# Patient Record
Sex: Female | Born: 1970 | Race: Black or African American | Hispanic: No | Marital: Married | Smoking: Never smoker
Health system: Southern US, Community
[De-identification: ages and names within clinical notes are randomized; demographics above are authoritative.]

## PROBLEM LIST (undated history)

## (undated) DIAGNOSIS — I10 Essential (primary) hypertension: Secondary | ICD-10-CM

## (undated) DIAGNOSIS — T7840XA Allergy, unspecified, initial encounter: Secondary | ICD-10-CM

## (undated) DIAGNOSIS — C801 Malignant (primary) neoplasm, unspecified: Secondary | ICD-10-CM

## (undated) HISTORY — PX: TOE AMPUTATION: SHX809

## (undated) HISTORY — DX: Allergy, unspecified, initial encounter: T78.40XA

---

## 2000-02-21 ENCOUNTER — Other Ambulatory Visit: Admission: RE | Admit: 2000-02-21 | Discharge: 2000-02-21 | Payer: Self-pay | Admitting: *Deleted

## 2000-08-23 ENCOUNTER — Emergency Department (HOSPITAL_COMMUNITY): Admission: EM | Admit: 2000-08-23 | Discharge: 2000-08-24 | Payer: Self-pay | Admitting: Emergency Medicine

## 2001-03-19 ENCOUNTER — Other Ambulatory Visit: Admission: RE | Admit: 2001-03-19 | Discharge: 2001-03-19 | Payer: Self-pay | Admitting: *Deleted

## 2001-10-08 ENCOUNTER — Emergency Department (HOSPITAL_COMMUNITY): Admission: EM | Admit: 2001-10-08 | Discharge: 2001-10-08 | Payer: Self-pay | Admitting: Emergency Medicine

## 2002-10-31 ENCOUNTER — Emergency Department (HOSPITAL_COMMUNITY): Admission: EM | Admit: 2002-10-31 | Discharge: 2002-10-31 | Payer: Self-pay | Admitting: Emergency Medicine

## 2005-07-01 ENCOUNTER — Encounter: Admission: RE | Admit: 2005-07-01 | Discharge: 2005-09-29 | Payer: Self-pay | Admitting: Internal Medicine

## 2010-05-01 ENCOUNTER — Emergency Department (HOSPITAL_COMMUNITY): Admission: EM | Admit: 2010-05-01 | Discharge: 2010-05-01 | Payer: Self-pay | Admitting: Emergency Medicine

## 2010-05-04 ENCOUNTER — Inpatient Hospital Stay (HOSPITAL_COMMUNITY): Admission: AD | Admit: 2010-05-04 | Discharge: 2010-05-08 | Payer: Self-pay | Admitting: Obstetrics and Gynecology

## 2010-05-08 ENCOUNTER — Encounter: Admission: RE | Admit: 2010-05-08 | Discharge: 2010-05-08 | Payer: Self-pay | Admitting: Obstetrics and Gynecology

## 2010-09-14 ENCOUNTER — Inpatient Hospital Stay (HOSPITAL_COMMUNITY): Admission: AD | Admit: 2010-09-14 | Discharge: 2010-09-14 | Payer: Self-pay | Admitting: Obstetrics and Gynecology

## 2010-09-23 ENCOUNTER — Inpatient Hospital Stay (HOSPITAL_COMMUNITY): Admission: AD | Admit: 2010-09-23 | Discharge: 2010-09-27 | Payer: Self-pay | Admitting: Obstetrics and Gynecology

## 2010-09-23 ENCOUNTER — Encounter (INDEPENDENT_AMBULATORY_CARE_PROVIDER_SITE_OTHER): Payer: Self-pay | Admitting: Obstetrics and Gynecology

## 2010-10-22 ENCOUNTER — Ambulatory Visit: Payer: Self-pay | Admitting: Oncology

## 2010-10-26 ENCOUNTER — Ambulatory Visit: Payer: Self-pay | Admitting: Genetic Counselor

## 2010-10-30 ENCOUNTER — Ambulatory Visit (HOSPITAL_COMMUNITY)
Admission: RE | Admit: 2010-10-30 | Discharge: 2010-10-30 | Payer: Self-pay | Source: Home / Self Care | Attending: General Surgery | Admitting: General Surgery

## 2010-11-06 ENCOUNTER — Ambulatory Visit (HOSPITAL_COMMUNITY): Admission: RE | Admit: 2010-11-06 | Payer: Self-pay | Source: Home / Self Care | Admitting: Oncology

## 2010-12-02 ENCOUNTER — Encounter: Payer: Self-pay | Admitting: Oncology

## 2011-01-11 ENCOUNTER — Other Ambulatory Visit (HOSPITAL_COMMUNITY): Payer: Self-pay | Admitting: General Surgery

## 2011-01-11 DIAGNOSIS — C4370 Malignant melanoma of unspecified lower limb, including hip: Secondary | ICD-10-CM

## 2011-01-21 LAB — URINALYSIS, ROUTINE W REFLEX MICROSCOPIC
Leukocytes, UA: NEGATIVE
Nitrite: NEGATIVE
Protein, ur: NEGATIVE mg/dL
Urobilinogen, UA: 0.2 mg/dL (ref 0.0–1.0)

## 2011-01-21 LAB — DIFFERENTIAL
Lymphocytes Relative: 30 % (ref 12–46)
Monocytes Absolute: 0.3 10*3/uL (ref 0.1–1.0)
Monocytes Relative: 4 % (ref 3–12)
Neutro Abs: 4.8 10*3/uL (ref 1.7–7.7)

## 2011-01-21 LAB — COMPREHENSIVE METABOLIC PANEL
AST: 22 U/L (ref 0–37)
Albumin: 3.5 g/dL (ref 3.5–5.2)
BUN: 8 mg/dL (ref 6–23)
Creatinine, Ser: 0.72 mg/dL (ref 0.4–1.2)
GFR calc Af Amer: >60 mL/min (ref >60)
Potassium: 3.8 mEq/L (ref 3.5–5.1)
Total Protein: 6.4 g/dL (ref 6.0–8.3)

## 2011-01-21 LAB — URINE MICROSCOPIC-ADD ON

## 2011-01-21 LAB — CBC
MCV: 83.4 fL (ref 78.0–100.0)
Platelets: 248 10*3/uL (ref 150–400)
RDW: 13.5 % (ref 11.5–15.5)
WBC: 7.8 10*3/uL (ref 4.0–10.5)

## 2011-01-21 LAB — APTT: aPTT: 27 seconds (ref 24–37)

## 2011-01-21 LAB — SURGICAL PCR SCREEN
MRSA, PCR: NEGATIVE
Staphylococcus aureus: NEGATIVE

## 2011-01-22 LAB — CBC
HCT: 36.3 % (ref 36.0–46.0)
HCT: 36.3 % (ref 36.0–46.0)
Hemoglobin: 12 g/dL (ref 12.0–15.0)
MCH: 29.7 pg (ref 26.0–34.0)
MCH: 29.8 pg (ref 26.0–34.0)
MCHC: 33.7 g/dL (ref 30.0–36.0)
MCHC: 34 g/dL (ref 30.0–36.0)
MCHC: 34.1 g/dL (ref 30.0–36.0)
MCV: 87.2 fL (ref 78.0–100.0)
MCV: 87.6 fL (ref 78.0–100.0)
MCV: 87.8 fL (ref 78.0–100.0)
MCV: 87.8 fL (ref 78.0–100.0)
Platelets: 193 10*3/uL (ref 150–400)
Platelets: 205 10*3/uL (ref 150–400)
Platelets: 225 10*3/uL (ref 150–400)
Platelets: 230 10*3/uL (ref 150–400)
RBC: 3.62 MIL/uL — ABNORMAL LOW (ref 3.87–5.11)
RBC: 4.13 MIL/uL (ref 3.87–5.11)
RBC: 4.13 MIL/uL (ref 3.87–5.11)
RDW: 13.9 % (ref 11.5–15.5)
RDW: 14.3 % (ref 11.5–15.5)
RDW: 14.4 % (ref 11.5–15.5)
WBC: 8.5 10*3/uL (ref 4.0–10.5)
WBC: 8.8 10*3/uL (ref 4.0–10.5)
WBC: 9.5 10*3/uL (ref 4.0–10.5)

## 2011-01-22 LAB — COMPREHENSIVE METABOLIC PANEL
Albumin: 2.7 g/dL — ABNORMAL LOW (ref 3.5–5.2)
Alkaline Phosphatase: 140 U/L — ABNORMAL HIGH (ref 39–117)
Alkaline Phosphatase: 154 U/L — ABNORMAL HIGH (ref 39–117)
BUN: 11 mg/dL (ref 6–23)
BUN: 9 mg/dL (ref 6–23)
Chloride: 108 mEq/L (ref 96–112)
Chloride: 110 mEq/L (ref 96–112)
Glucose, Bld: 81 mg/dL (ref 70–99)
Potassium: 3.9 mEq/L (ref 3.5–5.1)
Potassium: 4.2 mEq/L (ref 3.5–5.1)
Total Bilirubin: 0.4 mg/dL (ref 0.3–1.2)
Total Bilirubin: 0.4 mg/dL (ref 0.3–1.2)

## 2011-01-22 LAB — URIC ACID
Uric Acid, Serum: 5.6 mg/dL (ref 2.4–7.0)
Uric Acid, Serum: 6.4 mg/dL (ref 2.4–7.0)

## 2011-01-22 LAB — GLUCOSE, CAPILLARY
Glucose-Capillary: 100 mg/dL — ABNORMAL HIGH (ref 70–99)
Glucose-Capillary: 100 mg/dL — ABNORMAL HIGH (ref 70–99)
Glucose-Capillary: 140 mg/dL — ABNORMAL HIGH (ref 70–99)
Glucose-Capillary: 63 mg/dL — ABNORMAL LOW (ref 70–99)
Glucose-Capillary: 64 mg/dL — ABNORMAL LOW (ref 70–99)
Glucose-Capillary: 68 mg/dL — ABNORMAL LOW (ref 70–99)
Glucose-Capillary: 72 mg/dL (ref 70–99)
Glucose-Capillary: 80 mg/dL (ref 70–99)
Glucose-Capillary: 93 mg/dL (ref 70–99)

## 2011-01-22 LAB — RPR: RPR Ser Ql: NONREACTIVE

## 2011-01-22 LAB — URINALYSIS, ROUTINE W REFLEX MICROSCOPIC
Bilirubin Urine: NEGATIVE
Glucose, UA: NEGATIVE mg/dL
Ketones, ur: NEGATIVE mg/dL
Nitrite: NEGATIVE
pH: 7 (ref 5.0–8.0)

## 2011-01-24 ENCOUNTER — Encounter (HOSPITAL_COMMUNITY)
Admission: RE | Admit: 2011-01-24 | Discharge: 2011-01-24 | Disposition: A | Discharge status: Home / Self Care | Payer: BC Managed Care – PPO | Source: Ambulatory Visit | Attending: General Surgery | Admitting: General Surgery

## 2011-01-24 ENCOUNTER — Encounter (HOSPITAL_COMMUNITY): Payer: Self-pay

## 2011-01-24 DIAGNOSIS — C4370 Malignant melanoma of unspecified lower limb, including hip: Secondary | ICD-10-CM | POA: Insufficient documentation

## 2011-01-24 HISTORY — DX: Essential (primary) hypertension: I10

## 2011-01-24 HISTORY — DX: Malignant (primary) neoplasm, unspecified: C80.1

## 2011-01-24 LAB — GLUCOSE, CAPILLARY: Glucose-Capillary: 120 mg/dL — ABNORMAL HIGH (ref 70–99)

## 2011-01-24 MED ORDER — FLUDEOXYGLUCOSE F - 18 (FDG) INJECTION
18.3000 | Freq: Once | INTRAVENOUS | Status: AC | PRN
  Administered 2011-01-24: 18.3 via INTRAVENOUS

## 2011-01-27 LAB — GLUCOSE, CAPILLARY
Glucose-Capillary: 138 mg/dL — ABNORMAL HIGH (ref 70–99)
Glucose-Capillary: 144 mg/dL — ABNORMAL HIGH (ref 70–99)
Glucose-Capillary: 148 mg/dL — ABNORMAL HIGH (ref 70–99)
Glucose-Capillary: 150 mg/dL — ABNORMAL HIGH (ref 70–99)
Glucose-Capillary: 151 mg/dL — ABNORMAL HIGH (ref 70–99)
Glucose-Capillary: 171 mg/dL — ABNORMAL HIGH (ref 70–99)
Glucose-Capillary: 184 mg/dL — ABNORMAL HIGH (ref 70–99)
Glucose-Capillary: 189 mg/dL — ABNORMAL HIGH (ref 70–99)
Glucose-Capillary: 195 mg/dL — ABNORMAL HIGH (ref 70–99)
Glucose-Capillary: 198 mg/dL — ABNORMAL HIGH (ref 70–99)
Glucose-Capillary: 203 mg/dL — ABNORMAL HIGH (ref 70–99)
Glucose-Capillary: 232 mg/dL — ABNORMAL HIGH (ref 70–99)

## 2011-01-27 LAB — CBC
HCT: 35.5 % — ABNORMAL LOW (ref 36.0–46.0)
Hemoglobin: 11.9 g/dL — ABNORMAL LOW (ref 12.0–15.0)
MCH: 29.4 pg (ref 26.0–34.0)
RBC: 4.07 MIL/uL (ref 3.87–5.11)

## 2011-01-27 LAB — COMPREHENSIVE METABOLIC PANEL
ALT: 15 U/L (ref 0–35)
AST: 15 U/L (ref 0–37)
CO2: 22 mEq/L (ref 19–32)
Chloride: 106 mEq/L (ref 96–112)
Creatinine, Ser: 0.45 mg/dL (ref 0.4–1.2)
GFR calc Af Amer: >60 mL/min (ref >60)
GFR calc non Af Amer: >60 mL/min (ref >60)
Sodium: 133 mEq/L — ABNORMAL LOW (ref 135–145)
Total Bilirubin: 0.4 mg/dL (ref 0.3–1.2)

## 2011-01-27 LAB — URINALYSIS, ROUTINE W REFLEX MICROSCOPIC
Bilirubin Urine: NEGATIVE
Bilirubin Urine: NEGATIVE
Glucose, UA: 100 mg/dL — AB
Glucose, UA: NEGATIVE mg/dL
Hgb urine dipstick: NEGATIVE
Ketones, ur: 15 mg/dL — AB
Specific Gravity, Urine: 1.025 (ref 1.005–1.030)
Specific Gravity, Urine: >1.03 — ABNORMAL HIGH (ref 1.005–1.030)
Urobilinogen, UA: 0.2 mg/dL (ref 0.0–1.0)
pH: 6 (ref 5.0–8.0)

## 2011-03-20 ENCOUNTER — Encounter (INDEPENDENT_AMBULATORY_CARE_PROVIDER_SITE_OTHER): Payer: Self-pay | Admitting: General Surgery

## 2011-09-30 IMAGING — CR DG CHEST 2V
2 series · 2 of 2 positions shown · non-contrast
Comparison: None.

CLINICAL DATA: Fifth toe melanoma.  Preoperative evaluation.

CHEST - 2 VIEW

[view not recorded (1 of 2)]
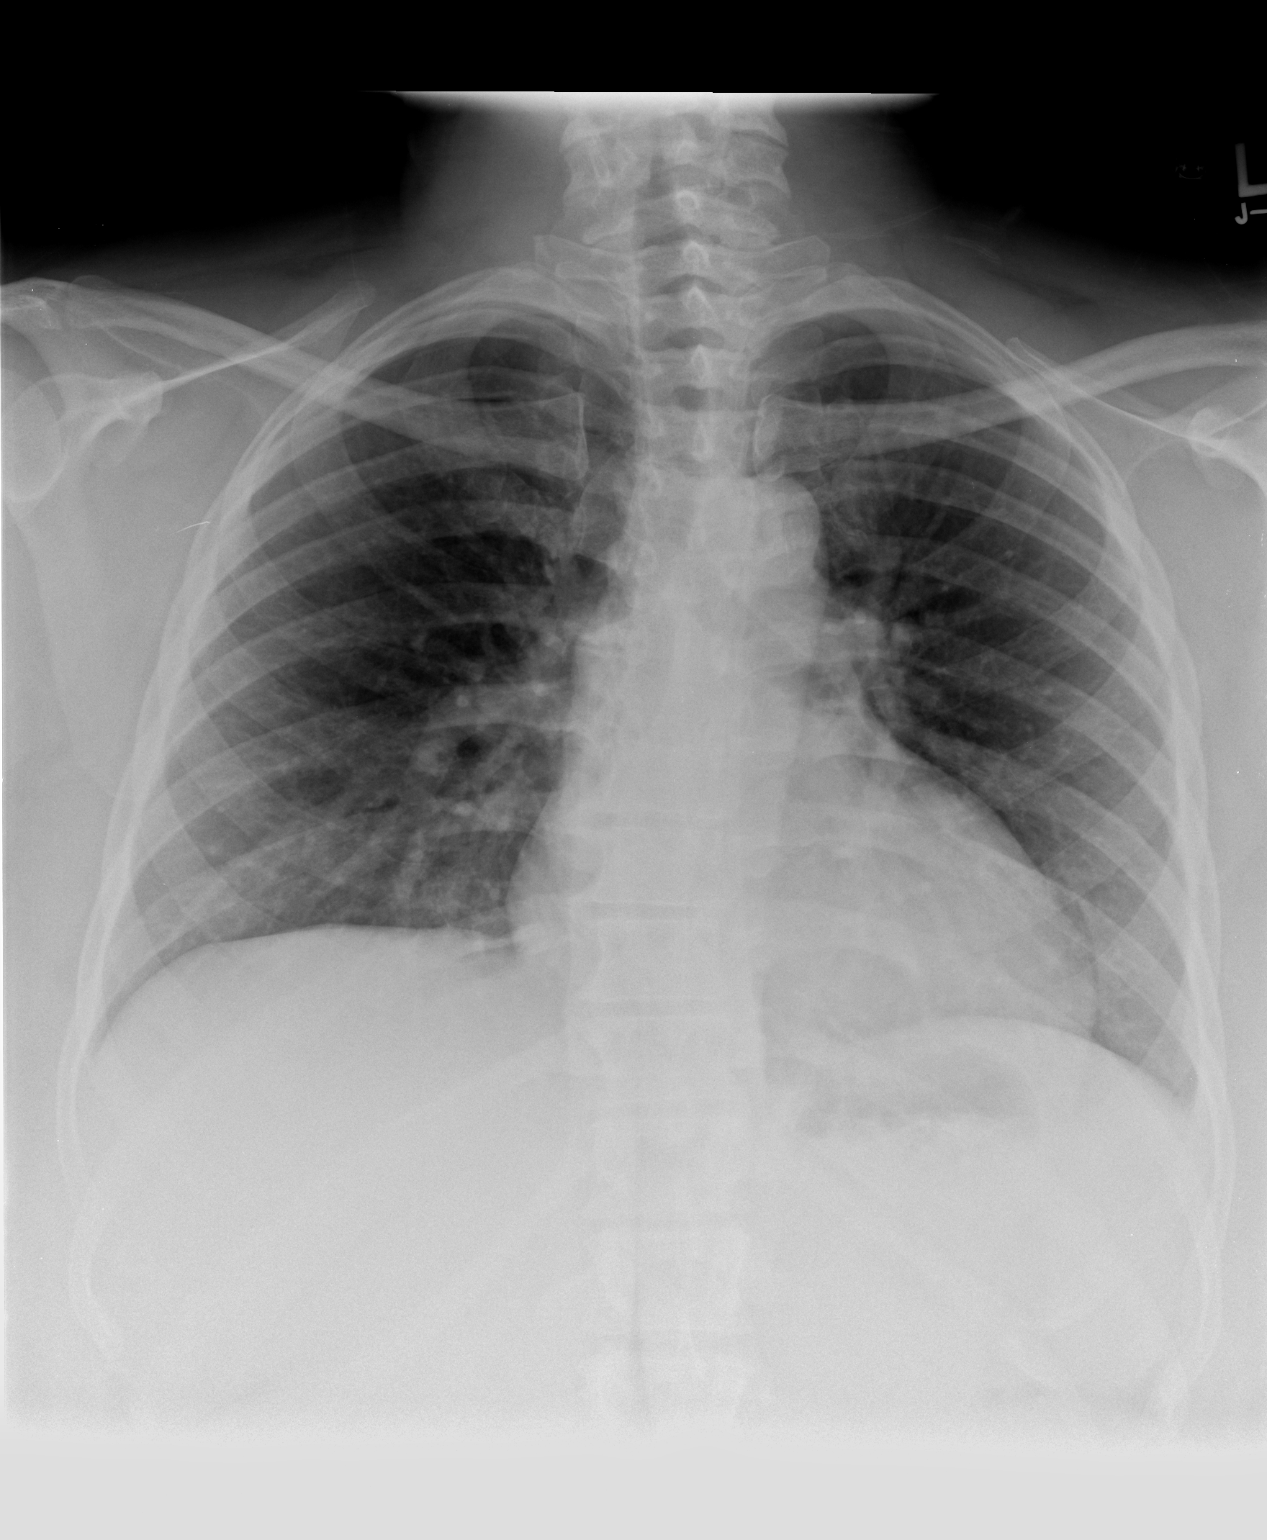

[view not recorded (2 of 2)]
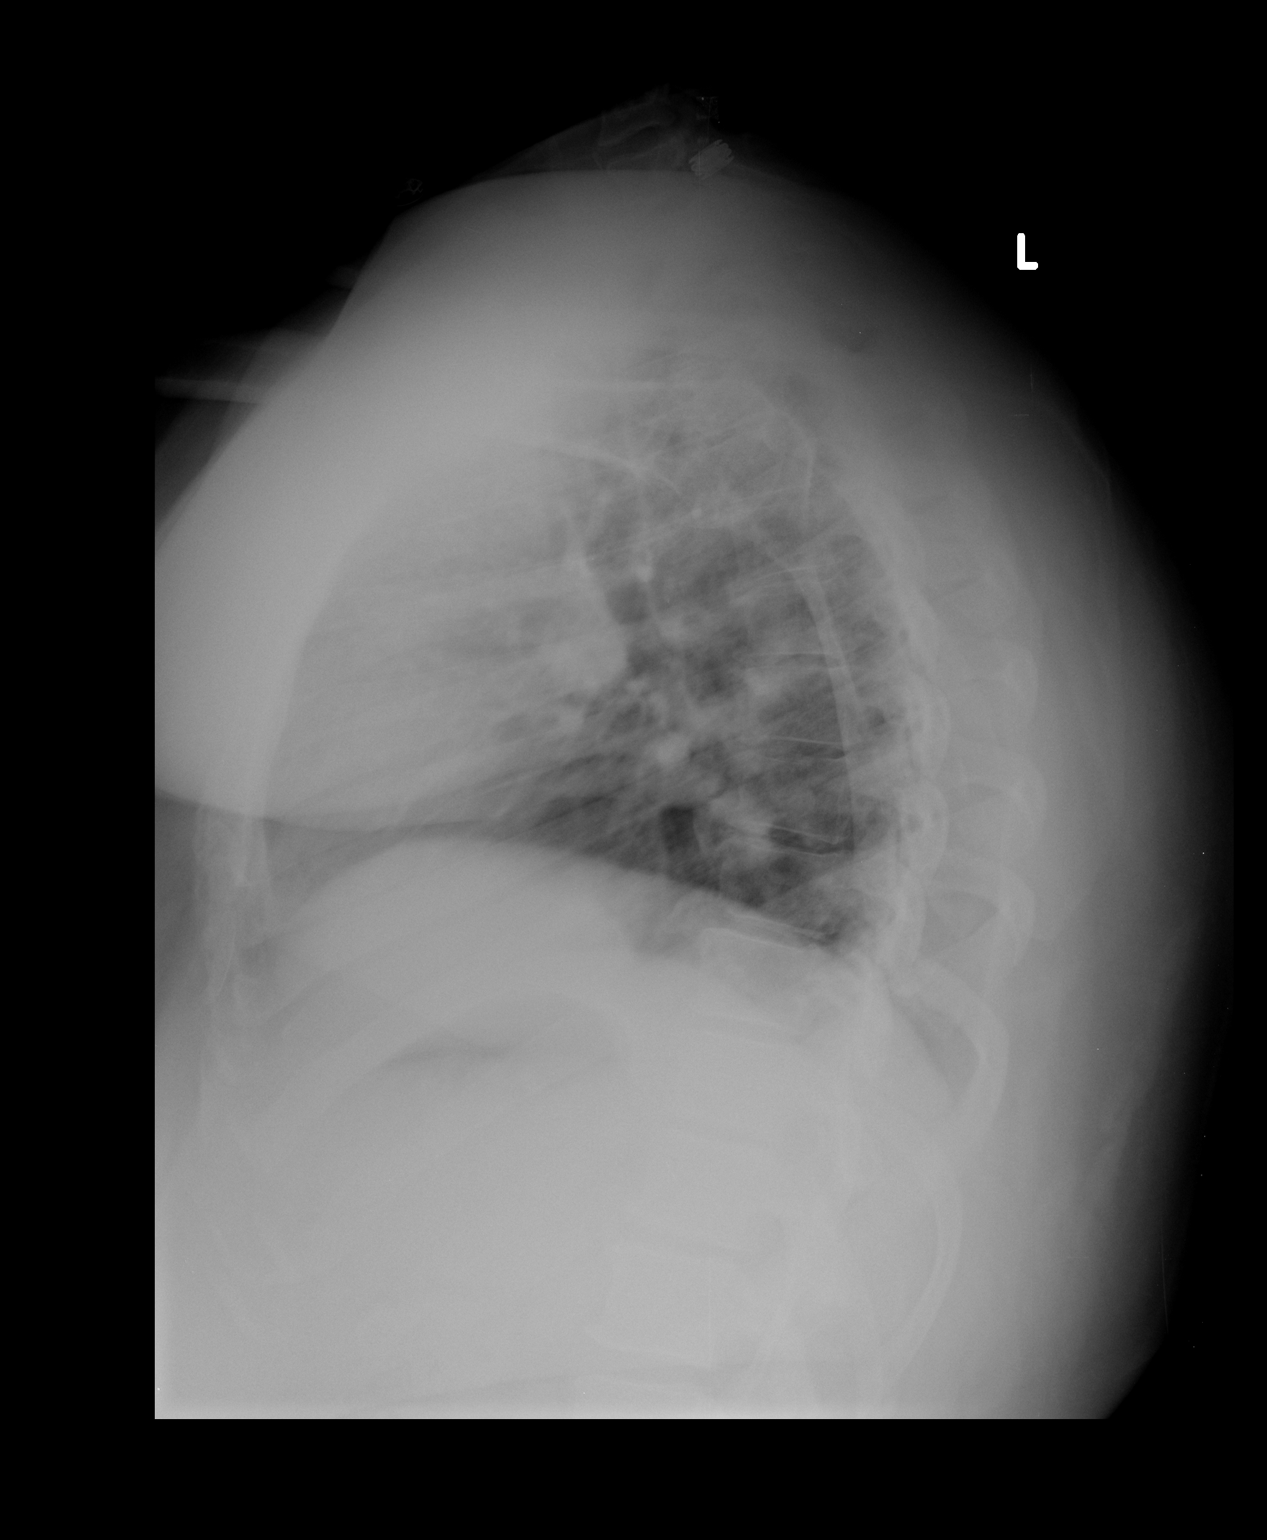

[2 of 2 positions shown; findings below may reference images not displayed]

FINDINGS: The lungs are low volume but clear bilaterally.  No
confluent airspace opacities, pleural effuions or pneumothoracies
are seen.  The heart is normal in size and contour.  The upper
abdomen and osseous structures are normal.
IMPRESSION: No acute cardiopulmonary disease.

## 2012-03-20 ENCOUNTER — Other Ambulatory Visit: Payer: Self-pay | Admitting: Obstetrics and Gynecology

## 2013-04-08 ENCOUNTER — Other Ambulatory Visit: Payer: Self-pay | Admitting: Obstetrics and Gynecology

## 2013-06-10 ENCOUNTER — Encounter: Payer: BC Managed Care – PPO | Attending: Obstetrics and Gynecology

## 2013-06-10 VITALS — Ht 67.0 in | Wt 257.6 lb

## 2013-06-10 DIAGNOSIS — E119 Type 2 diabetes mellitus without complications: Secondary | ICD-10-CM | POA: Insufficient documentation

## 2013-06-10 DIAGNOSIS — Z713 Dietary counseling and surveillance: Secondary | ICD-10-CM | POA: Insufficient documentation

## 2013-06-10 NOTE — Patient Instructions (Signed)
Goals:  Follow Diabetes Meal Plan as instructed  Eat 3 meals and 2 snacks, every 3-5 hrs  Limit carbohydrate intake to 30-45 grams carbohydrate/meal  Limit carbohydrate intake to 15 grams carbohydrate/snack  Add lean protein foods to meals/snacks  Monitor glucose levels as instructed by your doctor  Aim for 30 mins of physical activity daily  Bring food record and glucose log to your next nutrition visit 

## 2013-06-10 NOTE — Progress Notes (Signed)
Patient was seen on 06/10/13 for the first of a series of three diabetes self-management courses at the [facility].   Current HbA1c: 9.8%  The following learning objectives were met by the patient during this course:   Defines the role of glucose and insulin  Identifies type of diabetes and pathophysiology  Defines the diagnostic criteria for diabetes and prediabetes  States the risk factors for Type 2 Diabetes  States the symptoms of Type 2 Diabetes  Defines Type 2 Diabetes treatment goals  Defines Type 2 Diabetes treatment options  States the rationale for glucose monitoring  Identifies A1C, glucose targets, and testing times  Identifies proper sharps disposal  Defines the purpose of a diabetes food plan  Identifies carbohydrate food groups  Defines effects of carbohydrate foods on glucose levels  Identifies carbohydrate choices/grams/food labels  States benefits of physical activity and effect on glucose  Review of suggested activity guidelines  Handouts given during class include:  Type 2 Diabetes: Basics Book  My Food Plan Book  Food and Activity Log  Your patient has identified their diabetes self-care support plan as:  [facility] support group  Follow-Up Plan: Attend core 2 and core 3

## 2013-07-01 ENCOUNTER — Encounter: Payer: BC Managed Care – PPO | Attending: Obstetrics and Gynecology

## 2013-07-01 DIAGNOSIS — Z713 Dietary counseling and surveillance: Secondary | ICD-10-CM | POA: Insufficient documentation

## 2013-07-01 DIAGNOSIS — E119 Type 2 diabetes mellitus without complications: Secondary | ICD-10-CM | POA: Insufficient documentation

## 2013-07-01 NOTE — Progress Notes (Signed)
Patient was seen on 07/01/13 for the second of a series of three diabetes self-management courses at the [facility]. The following learning objectives were met by the patient during this course:   Explain basic nutrition maintenance and quality assurance  Describe causes, symptoms and treatment of hypoglycemia and hyperglycemia  Explain how to manage diabetes during illness  Describe the importance of good nutrition for health and healthy eating strategies  List strategies to follow meal plan when dining out  Describe the effects of alcohol on glucose and how to use it safely  Describe problem solving skills for day-to-day glucose challenges  Describe strategies to use when treatment plan needs to change  Identify important factors involved in successful weight loss  Describe ways to remain physically active  Describe the impact of regular activity on insulin resistance  Identify current diabetes medications, their action on blood glucose, and [pssible side effects.  Handouts given in class:  Refrigerator magnet for Sick Day Guidelines  [facility] Oral medication/insulin handout  Your patient has identified their diabetes self-care support plan as:  [facility] support group   Follow-Up Plan: Patient will attend the final class of the ADA Diabetes Self-Care Education.   

## 2013-09-08 ENCOUNTER — Encounter: Payer: BC Managed Care – PPO | Attending: Family Medicine

## 2013-09-08 DIAGNOSIS — Z713 Dietary counseling and surveillance: Secondary | ICD-10-CM | POA: Insufficient documentation

## 2013-09-08 DIAGNOSIS — E119 Type 2 diabetes mellitus without complications: Secondary | ICD-10-CM | POA: Insufficient documentation

## 2014-01-13 ENCOUNTER — Ambulatory Visit: Payer: BC Managed Care – PPO | Admitting: *Deleted

## 2014-01-16 ENCOUNTER — Encounter (HOSPITAL_COMMUNITY): Payer: Self-pay | Admitting: Emergency Medicine

## 2014-01-16 ENCOUNTER — Emergency Department (HOSPITAL_COMMUNITY)
Admission: EM | Admit: 2014-01-16 | Discharge: 2014-01-16 | Disposition: A | Discharge status: Home / Self Care | Payer: BC Managed Care – PPO | Attending: Emergency Medicine | Admitting: Emergency Medicine

## 2014-01-16 DIAGNOSIS — Z87828 Personal history of other (healed) physical injury and trauma: Secondary | ICD-10-CM | POA: Insufficient documentation

## 2014-01-16 DIAGNOSIS — I10 Essential (primary) hypertension: Secondary | ICD-10-CM | POA: Insufficient documentation

## 2014-01-16 DIAGNOSIS — M5430 Sciatica, unspecified side: Secondary | ICD-10-CM | POA: Insufficient documentation

## 2014-01-16 DIAGNOSIS — Z79899 Other long term (current) drug therapy: Secondary | ICD-10-CM | POA: Insufficient documentation

## 2014-01-16 DIAGNOSIS — Z880 Allergy status to penicillin: Secondary | ICD-10-CM | POA: Insufficient documentation

## 2014-01-16 DIAGNOSIS — Z85828 Personal history of other malignant neoplasm of skin: Secondary | ICD-10-CM | POA: Insufficient documentation

## 2014-01-16 DIAGNOSIS — E119 Type 2 diabetes mellitus without complications: Secondary | ICD-10-CM | POA: Insufficient documentation

## 2014-01-16 MED ORDER — OXYCODONE-ACETAMINOPHEN 5-325 MG PO TABS
1.0000 | ORAL_TABLET | Freq: Once | ORAL | Status: AC
  Administered 2014-01-16: 1 via ORAL
  Filled 2014-01-16: qty 1

## 2014-01-16 MED ORDER — DIAZEPAM 5 MG PO TABS
5.0000 mg | ORAL_TABLET | Freq: Once | ORAL | Status: AC
  Administered 2014-01-16: 5 mg via ORAL
  Filled 2014-01-16: qty 1

## 2014-01-16 MED ORDER — CYCLOBENZAPRINE HCL 10 MG PO TABS: 10.0000 mg | ORAL_TABLET | Freq: Two times a day (BID) | ORAL | Status: DC | PRN

## 2014-01-16 MED ORDER — IBUPROFEN 600 MG PO TABS
600.0000 mg | ORAL_TABLET | Freq: Four times a day (QID) | ORAL | Status: DC | PRN
Start: 2014-01-16 — End: 2022-02-01

## 2014-01-16 MED ORDER — HYDROCODONE-ACETAMINOPHEN 5-325 MG PO TABS: 1.0000 | ORAL_TABLET | ORAL | Status: DC | PRN

## 2014-01-16 NOTE — ED Notes (Signed)
Pt given ICE pack for transport.
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
## 2014-01-16 NOTE — Discharge Instructions (Signed)
Sciatica °Sciatica is pain, weakness, numbness, or tingling along the path of the sciatic nerve. The nerve starts in the lower back and runs down the back of each leg. The nerve controls the muscles in the lower leg and in the back of the knee, while also providing sensation to the back of the thigh, lower leg, and the sole of your foot. Sciatica is a symptom of another medical condition. For instance, nerve damage or certain conditions, such as a herniated disk or bone spur on the spine, pinch or put pressure on the sciatic nerve. This causes the pain, weakness, or other sensations normally associated with sciatica. Generally, sciatica only affects one side of the body. °CAUSES  °· Herniated or slipped disc. °· Degenerative disk disease. °· A pain disorder involving the narrow muscle in the buttocks (piriformis syndrome). °· Pelvic injury or fracture. °· Pregnancy. °· Tumor (rare). °SYMPTOMS  °Symptoms can vary from mild to very severe. The symptoms usually travel from the low back to the buttocks and down the back of the leg. Symptoms can include: °· Mild tingling or dull aches in the lower back, leg, or hip. °· Numbness in the back of the calf or sole of the foot. °· Burning sensations in the lower back, leg, or hip. °· Sharp pains in the lower back, leg, or hip. °· Leg weakness. °· Severe back pain inhibiting movement. °These symptoms may get worse with coughing, sneezing, laughing, or prolonged sitting or standing. Also, being overweight may worsen symptoms. °DIAGNOSIS  °Your caregiver will perform a physical exam to look for common symptoms of sciatica. He or she may ask you to do certain movements or activities that would trigger sciatic nerve pain. Other tests may be performed to find the cause of the sciatica. These may include: °· Blood tests. °· X-rays. °· Imaging tests, such as an MRI or CT scan. °TREATMENT  °Treatment is directed at the cause of the sciatic pain. Sometimes, treatment is not necessary  and the pain and discomfort goes away on its own. If treatment is needed, your caregiver may suggest: °· Over-the-counter medicines to relieve pain. °· Prescription medicines, such as anti-inflammatory medicine, muscle relaxants, or narcotics. °· Applying heat or ice to the painful area. °· Steroid injections to lessen pain, irritation, and inflammation around the nerve. °· Reducing activity during periods of pain. °· Exercising and stretching to strengthen your abdomen and improve flexibility of your spine. Your caregiver may suggest losing weight if the extra weight makes the back pain worse. °· Physical therapy. °· Surgery to eliminate what is pressing or pinching the nerve, such as a bone spur or part of a herniated disk. °HOME CARE INSTRUCTIONS  °· Only take over-the-counter or prescription medicines for pain or discomfort as directed by your caregiver. °· Apply ice to the affected area for 20 minutes, 3 4 times a day for the first 48 72 hours. Then try heat in the same way. °· Exercise, stretch, or perform your usual activities if these do not aggravate your pain. °· Attend physical therapy sessions as directed by your caregiver. °· Keep all follow-up appointments as directed by your caregiver. °· Do not wear high heels or shoes that do not provide proper support. °· Check your mattress to see if it is too soft. A firm mattress may lessen your pain and discomfort. °SEEK IMMEDIATE MEDICAL CARE IF:  °· You lose control of your bowel or bladder (incontinence). °· You have increasing weakness in the lower back,   pelvis, buttocks, or legs. °· You have redness or swelling of your back. °· You have a burning sensation when you urinate. °· You have pain that gets worse when you lie down or awakens you at night. °· Your pain is worse than you have experienced in the past. °· Your pain is lasting longer than 4 weeks. °· You are suddenly losing weight without reason. °MAKE SURE YOU: °· Understand these  instructions. °· Will watch your condition. °· Will get help right away if you are not doing well or get worse. °Document Released: 10/22/2001 Document Revised: 04/28/2012 Document Reviewed: 03/08/2012 °Exit[employer]® Patient Information ©2014 [employer]. ° °
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
## 2014-02-10 ENCOUNTER — Other Ambulatory Visit: Payer: Self-pay | Admitting: Orthopedic Surgery

## 2014-02-10 DIAGNOSIS — M545 Low back pain, unspecified: Secondary | ICD-10-CM

## 2014-02-17 ENCOUNTER — Ambulatory Visit
Admission: RE | Admit: 2014-02-17 | Discharge: 2014-02-17 | Disposition: A | Discharge status: Home / Self Care | Payer: BC Managed Care – PPO | Source: Ambulatory Visit | Attending: Orthopedic Surgery | Admitting: Orthopedic Surgery

## 2014-02-17 DIAGNOSIS — M545 Low back pain, unspecified: Secondary | ICD-10-CM

## 2019-07-01 ENCOUNTER — Other Ambulatory Visit: Payer: Self-pay

## 2019-07-01 ENCOUNTER — Encounter (HOSPITAL_COMMUNITY): Payer: Self-pay | Admitting: Emergency Medicine

## 2019-07-01 ENCOUNTER — Emergency Department (HOSPITAL_COMMUNITY)
Admission: EM | Admit: 2019-07-01 | Discharge: 2019-07-01 | Disposition: A | Discharge status: Home / Self Care | Payer: Medicaid Other | Attending: Emergency Medicine | Admitting: Emergency Medicine

## 2019-07-01 DIAGNOSIS — E1165 Type 2 diabetes mellitus with hyperglycemia: Secondary | ICD-10-CM | POA: Insufficient documentation

## 2019-07-01 DIAGNOSIS — Z760 Encounter for issue of repeat prescription: Secondary | ICD-10-CM

## 2019-07-01 DIAGNOSIS — R1084 Generalized abdominal pain: Secondary | ICD-10-CM | POA: Diagnosis not present

## 2019-07-01 DIAGNOSIS — R1031 Right lower quadrant pain: Secondary | ICD-10-CM | POA: Diagnosis present

## 2019-07-01 DIAGNOSIS — Z7984 Long term (current) use of oral hypoglycemic drugs: Secondary | ICD-10-CM | POA: Insufficient documentation

## 2019-07-01 DIAGNOSIS — Z79899 Other long term (current) drug therapy: Secondary | ICD-10-CM | POA: Diagnosis not present

## 2019-07-01 DIAGNOSIS — I10 Essential (primary) hypertension: Secondary | ICD-10-CM | POA: Diagnosis not present

## 2019-07-01 LAB — COMPREHENSIVE METABOLIC PANEL
ALT: 17 U/L (ref 0–44)
AST: 16 U/L (ref 15–41)
Albumin: 3.9 g/dL (ref 3.5–5.0)
Alkaline Phosphatase: 89 U/L (ref 38–126)
Anion gap: 10 (ref 5–15)
BUN: 22 mg/dL — ABNORMAL HIGH (ref 6–20)
CO2: 24 mmol/L (ref 22–32)
Calcium: 9.4 mg/dL (ref 8.9–10.3)
Chloride: 100 mmol/L (ref 98–111)
Creatinine, Ser: 0.82 mg/dL (ref 0.44–1.00)
GFR calc Af Amer: >60 mL/min (ref >60)
GFR calc non Af Amer: >60 mL/min (ref >60)
Glucose, Bld: 389 mg/dL — ABNORMAL HIGH (ref 70–99)
Potassium: 3.6 mmol/L (ref 3.5–5.1)
Sodium: 134 mmol/L — ABNORMAL LOW (ref 135–145)
Total Bilirubin: 0.6 mg/dL (ref 0.3–1.2)
Total Protein: 7.8 g/dL (ref 6.5–8.1)

## 2019-07-01 LAB — URINALYSIS, ROUTINE W REFLEX MICROSCOPIC
Bacteria, UA: NONE SEEN
Bilirubin Urine: NEGATIVE
Glucose, UA: >=500 mg/dL — AB
Hgb urine dipstick: NEGATIVE
Ketones, ur: NEGATIVE mg/dL
Leukocytes,Ua: NEGATIVE
Nitrite: NEGATIVE
Protein, ur: NEGATIVE mg/dL
Specific Gravity, Urine: 1.032 — ABNORMAL HIGH (ref 1.005–1.030)
pH: 7 (ref 5.0–8.0)

## 2019-07-01 LAB — I-STAT BETA HCG BLOOD, ED (MC, WL, AP ONLY): I-stat hCG, quantitative: <5 m[IU]/mL (ref <5)

## 2019-07-01 LAB — CBC
HCT: 41.6 % (ref 36.0–46.0)
Hemoglobin: 13.6 g/dL (ref 12.0–15.0)
MCH: 27.9 pg (ref 26.0–34.0)
MCHC: 32.7 g/dL (ref 30.0–36.0)
MCV: 85.4 fL (ref 80.0–100.0)
Platelets: 271 10*3/uL (ref 150–400)
RBC: 4.87 MIL/uL (ref 3.87–5.11)
RDW: 13.4 % (ref 11.5–15.5)
WBC: 7.4 10*3/uL (ref 4.0–10.5)
nRBC: 0 % (ref 0.0–0.2)

## 2019-07-01 LAB — LIPASE, BLOOD: Lipase: 30 U/L (ref 11–51)

## 2019-07-01 MED ORDER — METFORMIN HCL 500 MG PO TABS: 500.0000 mg | ORAL_TABLET | Freq: Two times a day (BID) | ORAL | 0 refills | Status: DC

## 2019-07-01 MED ORDER — BLOOD GLUCOSE MONITOR KIT: PACK | 0 refills | Status: AC

## 2019-07-01 MED ORDER — LISINOPRIL 10 MG PO TABS: 10.0000 mg | ORAL_TABLET | Freq: Every day | ORAL | 0 refills | Status: DC

## 2019-07-01 MED ORDER — SODIUM CHLORIDE 0.9 % IV BOLUS
1000.0000 mL | Freq: Once | INTRAVENOUS | Status: AC
  Administered 2019-07-01: 21:00:00 1000 mL via INTRAVENOUS

## 2019-07-01 MED ORDER — SODIUM CHLORIDE 0.9% FLUSH
3.0000 mL | Freq: Once | INTRAVENOUS | Status: AC
  Administered 2019-07-01: 3 mL via INTRAVENOUS

## 2019-07-01 MED ORDER — FENTANYL CITRATE (PF) 100 MCG/2ML IJ SOLN
50.0000 ug | Freq: Once | INTRAMUSCULAR | Status: AC
  Administered 2019-07-01: 50 ug via INTRAVENOUS
  Filled 2019-07-01: qty 2

## 2019-07-01 MED ORDER — ONDANSETRON HCL 4 MG/2ML IJ SOLN
4.0000 mg | Freq: Once | INTRAMUSCULAR | Status: AC
  Administered 2019-07-01: 4 mg via INTRAVENOUS
  Filled 2019-07-01: qty 2

## 2019-07-01 NOTE — Progress Notes (Signed)
CSW received a call from the [facility] stating pt may need medications but also needs a PCP appointment.    EDP is aware that if pt has Medicaid then the pt has been assigned a PCP that the pt cannot deviate from unless a process is followed.  However, per the EDP, pt states she does not have Medicaid anymore and is requesting a PCP follow up.  EDP states EDP will place a [facility] RN CM consult with an order for face-to-face and an order for meds.  CSW talked to RN who verified pt's phone number on [facility] is correct: [phone] (Mobile)  Per EDP, English is pt's preferred language.  2nd shift ED CSW will leave handoff for 1st shift TOC RN CM.  CSW will continue to follow for D/C needs.  Alphonse Guild. Deval Mroczka, LCSW, LCAS, CSI Transitions of Care Clinical Social Worker Care Coordination Department Ph: [phone]

## 2019-07-01 NOTE — ED Provider Notes (Signed)
[facility] DEPT Provider Note   CSN: [id number] Arrival date & time: 07/01/19  1945     History   Chief Complaint Chief Complaint  Patient presents with  . Hypertension  . Abdominal Pain    HPI Madison Long is a 48 y.o. female.     Pt presents to the ED today with RLQ pain.  The pt said she developed the pain about 1 hr pta.  She said it radiated to her buttocks.  Pain is much better now.  No f/c.  No n/v.     Past Medical History:  Diagnosis Date  . Allergy   . Cancer ([place])    skin  . Diabetes mellitus   . Hypertension     There are no active problems to display for this patient.   Past Surgical History:  Procedure Laterality Date  . CESAREAN SECTION    . TOE AMPUTATION Right    5th toe     OB History   No obstetric history on file.      Home Medications    Prior to Admission medications   Medication Sig Start Date End Date Taking? Authorizing Provider  blood glucose meter kit and supplies KIT Dispense based on patient and insurance preference. Use up to four times daily as directed. (FOR ICD-9 250.00, 250.01). 07/01/19   Isla Pence, MD  Clobetasol Propionate 0.05 % shampoo Apply 1 application topically 2 (two) times daily.  01/12/14   [provider]  cyclobenzaprine (FLEXERIL) 10 MG tablet Take 1 tablet (10 mg total) by mouth 2 (two) times daily as needed for muscle spasms. 01/16/14   Teressa Lower, MD  HYDROcodone-acetaminophen (NORCO/VICODIN) 5-325 MG per tablet Take 1-2 tablets by mouth every 4 (four) hours as needed. 01/16/14   Teressa Lower, MD  ibuprofen (ADVIL,MOTRIN) 600 MG tablet Take 1 tablet (600 mg total) by mouth every 6 (six) hours as needed. 01/16/14   Teressa Lower, MD  lisinopril (ZESTRIL) 10 MG tablet Take 1 tablet (10 mg total) by mouth daily. 07/01/19   Isla Pence, MD  metFORMIN (GLUCOPHAGE) 500 MG tablet Take 1 tablet (500 mg total) by mouth 2 (two) times daily with a meal. 07/01/19   Isla Pence, MD  Multiple Vitamin (MULTIVITAMIN WITH MINERALS) TABS tablet Take 1 tablet by mouth daily.    [provider]    Family History Family History  Problem Relation Age of Onset  . Cancer Sister        1 HAS  . Hypertension Mother   . Cancer Mother        BREAST  . Hypertension Father     Social History Social History   Tobacco Use  . Smoking status: Never Smoker  . Smokeless tobacco: Never Used  Substance Use Topics  . Alcohol use: No  . Drug use: No     Allergies   Food, Morphine and related, and Penicillins   Review of Systems Review of Systems  Gastrointestinal: Positive for abdominal pain.  All other systems reviewed and are negative.    Physical Exam Updated Vital Signs BP (!) 204/101 (BP Location: Right Arm)   Pulse 85   Temp 98.1 F (36.7 C) (Oral)   Resp 16   Ht 5' 7"  (1.702 m)   Wt 118.8 kg   LMP 05/29/2019   SpO2 96%   BMI 41.04 kg/m   Physical Exam Vitals signs and nursing note reviewed.  Constitutional:      Appearance: She  is well-developed.  HENT:     Head: Normocephalic and atraumatic.     Mouth/Throat:     Mouth: Mucous membranes are moist.  Eyes:     Extraocular Movements: Extraocular movements intact.     Pupils: Pupils are equal, round, and reactive to light.  Cardiovascular:     Rate and Rhythm: Normal rate and regular rhythm.  Pulmonary:     Effort: Pulmonary effort is normal.     Breath sounds: Normal breath sounds.  Abdominal:     General: Abdomen is flat.     Tenderness: There is abdominal tenderness in the right lower quadrant.  Skin:    General: Skin is warm.     Capillary Refill: Capillary refill takes less than 2 seconds.  Neurological:     General: No focal deficit present.     Mental Status: She is alert and oriented to person, place, and time.  Psychiatric:        Mood and Affect: Mood normal.        Behavior: Behavior normal.      ED Treatments / Results  Labs (all labs ordered are  listed, but only abnormal results are displayed) Labs Reviewed  COMPREHENSIVE METABOLIC PANEL - Abnormal; Notable for the following components:      Result Value   Sodium 134 (*)    Glucose, Bld 389 (*)    BUN 22 (*)    All other components within normal limits  URINALYSIS, ROUTINE W REFLEX MICROSCOPIC - Abnormal; Notable for the following components:   Specific Gravity, Urine 1.032 (*)    Glucose, UA >=500 (*)    All other components within normal limits  LIPASE, BLOOD  CBC  I-STAT BETA HCG BLOOD, ED ([facility], [facility], [facility] ONLY)    EKG None  Radiology No results found.  Procedures Procedures (including critical care time)  Medications Ordered in ED Medications  sodium chloride flush (NS) 0.9 % injection 3 mL (3 mLs Intravenous Given 07/01/19 2016)  sodium chloride 0.9 % bolus 1,000 mL (1,000 mLs Intravenous New Bag/Given 07/01/19 2047)  ondansetron (ZOFRAN) injection 4 mg (4 mg Intravenous Given 07/01/19 2045)  fentaNYL (SUBLIMAZE) injection 50 mcg (50 mcg Intravenous Given 07/01/19 2045)     Initial Impression / Assessment and Plan / ED Course  I have reviewed the triage vital signs and the nursing notes.  Pertinent labs & imaging results that were available during my care of the patient were reviewed by me and considered in my medical decision making (see chart for details).    Pt is feeling much better.  She does have HTN and hyperglycemia.  She is not in DKA.  She has a hx of htn and dm2, but has not seen a pcp in years.  She will be placed back on lisinopril and metformin and I will consult sw to see if she can get a pcp.  I will also order a glucometer.  We talked about diet and exercise.   Final Clinical Impressions(s) / ED Diagnoses   Final diagnoses:  Essential hypertension  Generalized abdominal pain  Type 2 diabetes mellitus with hyperglycemia, without long-term current use of insulin ([place])  Medication refill    ED Discharge Orders         Ordered    lisinopril  (ZESTRIL) 10 MG tablet  Daily     07/01/19 2145    metFORMIN (GLUCOPHAGE) 500 MG tablet  2 times daily with meals     07/01/19 2145  blood glucose meter kit and supplies KIT     07/01/19 2146           Isla Pence, MD 07/01/19 2147

## 2019-07-01 NOTE — ED Notes (Signed)
ED Provider at bedside. 

## 2019-07-01 NOTE — ED Triage Notes (Signed)
Patient complaining of right lower abdominal pain that is radiating down between her legs to her butt. Patient states this started two hours ago. Pain 8/10.

## 2019-07-02 ENCOUNTER — Telehealth: Payer: Self-pay | Admitting: *Deleted

## 2019-07-02 NOTE — Telephone Encounter (Signed)
TOC CM contacted pt via phone. Pt has Medicaid to cover her Meds. Explained the importance of picking up meds from pharmacy and start taking her prescribed meds. States she starts a job on next week. Provided pt with contact number for her PCP, [facility]. She plans to call and schedule appt. Educated pt on the importance of compliance with follow up with PCP and taking meds as prescribed. Verbalized understanding. Jonnie Finner RN CCM Case Mgmt phone [phone]

## 2021-04-07 ENCOUNTER — Emergency Department (HOSPITAL_COMMUNITY): Payer: Medicaid Other

## 2021-04-07 ENCOUNTER — Other Ambulatory Visit: Payer: Self-pay

## 2021-04-07 ENCOUNTER — Emergency Department (HOSPITAL_COMMUNITY)
Admission: EM | Admit: 2021-04-07 | Discharge: 2021-04-07 | Disposition: A | Discharge status: Home / Self Care | Payer: Medicaid Other | Attending: Emergency Medicine | Admitting: Emergency Medicine

## 2021-04-07 DIAGNOSIS — Z85828 Personal history of other malignant neoplasm of skin: Secondary | ICD-10-CM | POA: Diagnosis not present

## 2021-04-07 DIAGNOSIS — E1165 Type 2 diabetes mellitus with hyperglycemia: Secondary | ICD-10-CM | POA: Diagnosis not present

## 2021-04-07 DIAGNOSIS — I6381 Other cerebral infarction due to occlusion or stenosis of small artery: Secondary | ICD-10-CM

## 2021-04-07 DIAGNOSIS — I10 Essential (primary) hypertension: Secondary | ICD-10-CM

## 2021-04-07 DIAGNOSIS — Z79899 Other long term (current) drug therapy: Secondary | ICD-10-CM | POA: Diagnosis not present

## 2021-04-07 DIAGNOSIS — Z7984 Long term (current) use of oral hypoglycemic drugs: Secondary | ICD-10-CM | POA: Diagnosis not present

## 2021-04-07 DIAGNOSIS — R112 Nausea with vomiting, unspecified: Secondary | ICD-10-CM | POA: Diagnosis not present

## 2021-04-07 DIAGNOSIS — R42 Dizziness and giddiness: Secondary | ICD-10-CM

## 2021-04-07 LAB — CBC WITH DIFFERENTIAL/PLATELET
Abs Immature Granulocytes: 0.03 10*3/uL (ref 0.00–0.07)
Basophils Absolute: 0 10*3/uL (ref 0.0–0.1)
Basophils Relative: 0 %
Eosinophils Absolute: 0.1 10*3/uL (ref 0.0–0.5)
Eosinophils Relative: 1 %
HCT: 42.5 % (ref 36.0–46.0)
Hemoglobin: 14 g/dL (ref 12.0–15.0)
Immature Granulocytes: 0 %
Lymphocytes Relative: 27 %
Lymphs Abs: 2.3 10*3/uL (ref 0.7–4.0)
MCH: 28.3 pg (ref 26.0–34.0)
MCHC: 32.9 g/dL (ref 30.0–36.0)
MCV: 85.9 fL (ref 80.0–100.0)
Monocytes Absolute: 0.4 10*3/uL (ref 0.1–1.0)
Monocytes Relative: 5 %
Neutro Abs: 5.6 10*3/uL (ref 1.7–7.7)
Neutrophils Relative %: 67 %
Platelets: 285 10*3/uL (ref 150–400)
RBC: 4.95 MIL/uL (ref 3.87–5.11)
RDW: 13.3 % (ref 11.5–15.5)
WBC: 8.4 10*3/uL (ref 4.0–10.5)
nRBC: 0 % (ref 0.0–0.2)

## 2021-04-07 LAB — BASIC METABOLIC PANEL
Anion gap: 9 (ref 5–15)
BUN: 17 mg/dL (ref 6–20)
CO2: 23 mmol/L (ref 22–32)
Calcium: 9.3 mg/dL (ref 8.9–10.3)
Chloride: 105 mmol/L (ref 98–111)
Creatinine, Ser: 0.57 mg/dL (ref 0.44–1.00)
GFR, Estimated: >60 mL/min (ref >60)
Glucose, Bld: 364 mg/dL — ABNORMAL HIGH (ref 70–99)
Potassium: 3.8 mmol/L (ref 3.5–5.1)
Sodium: 137 mmol/L (ref 135–145)

## 2021-04-07 LAB — CBG MONITORING, ED: Glucose-Capillary: 354 mg/dL — ABNORMAL HIGH (ref 70–99)

## 2021-04-07 MED ORDER — LISINOPRIL 10 MG PO TABS: 10.0000 mg | ORAL_TABLET | Freq: Every day | ORAL | 0 refills | Status: DC

## 2021-04-07 MED ORDER — ASPIRIN 81 MG PO CHEW: 81.0000 mg | CHEWABLE_TABLET | Freq: Every day | ORAL | 0 refills | Status: AC

## 2021-04-07 MED ORDER — ATORVASTATIN CALCIUM 40 MG PO TABS: 40.0000 mg | ORAL_TABLET | Freq: Every day | ORAL | 0 refills | Status: DC

## 2021-04-07 MED ORDER — METFORMIN HCL 500 MG PO TABS: 500.0000 mg | ORAL_TABLET | Freq: Two times a day (BID) | ORAL | 0 refills | Status: DC

## 2021-04-07 MED ORDER — LISINOPRIL 10 MG PO TABS
10.0000 mg | ORAL_TABLET | Freq: Once | ORAL | Status: AC
  Administered 2021-04-07: 10 mg via ORAL
  Filled 2021-04-07: qty 1

## 2021-04-07 MED ORDER — SODIUM CHLORIDE 0.9 % IV BOLUS
1000.0000 mL | Freq: Once | INTRAVENOUS | Status: AC
  Administered 2021-04-07: 1000 mL via INTRAVENOUS

## 2021-04-07 NOTE — ED Provider Notes (Signed)
Emergency Medicine Provider Triage Evaluation Note  Madison Long , a 50 y.o. female  was evaluated in triage.  Pt complains of nausea and vomiting.  States that she was getting out of her car to go to church when she simply felt dizzy and threw up.  She has not taken her medications for the last few days that she ran out 3 days ago.  She denies any headache, visual changes, unilateral numbness/weakness and denies any chest pain or shortness of breath.  Review of Systems  Positive: Near syncope, nv Negative: Chest pain, sob, vision changes, numbness/weakness  Physical Exam  BP (!) 209/101 (BP Location: Left Arm)   Pulse 76   Temp (!) 97.5 F (36.4 C) (Oral)   Resp 17   SpO2 98%  Gen:   Awake, no distress   Resp:  Normal effort  MSK:   Moves extremities without difficulty  Other:  Perrl, leftward nystagmus, 5/5 strength to the bue/ble  Medical Decision Making  Medically screening exam initiated at 5:01 PM.  Appropriate orders placed.  Mischelle Wittner was informed that the remainder of the evaluation will be completed by another provider, this initial triage assessment does not replace that evaluation, and the importance of remaining in the ED until their evaluation is complete.  Advised nursing staff that pt needs to be prioritized for the next room. Further advised ct that pt will need to be next on their list for imaging.   Bishop Dublin 04/07/21 1704    Dorie Rank, MD 04/07/21 2154

## 2021-04-07 NOTE — ED Provider Notes (Signed)
[facility] DEPT Provider Note   CSN: [id number] Arrival date & time: 04/07/21  1629     History Chief Complaint  Patient presents with  . Dizziness  . Emesis    Madison Long is a 50 y.o. female.  Madison Long has a history of diabetes and hypertension.  She has been somewhat hesitant to take medication and has been attempting lifestyle changes.  However, at this point she has realized that she is not taking care of herself.  She was going to church today, and when she got out of the car, she stood up and felt dizzy.  This was followed by the sensation of nausea, and one episode of vomiting.  She describes her feeling of dizziness as leaning to the side and feeling slightly off balance.  Sensation is not changed by position.  She had no other neurologic symptoms associated with this, and she currently feels a lot better.  However, she still has lingering dizziness.  The history is provided by the patient.  Dizziness Quality:  Imbalance Severity:  Moderate Onset quality:  Sudden Duration:  3 hours Timing:  Constant Progression:  Unchanged Chronicity:  New Context comment:  Occurred after getting out of the car and standing up.  She states that it was quite warm. Relieved by:  Nothing Worsened by:  Nothing Ineffective treatments:  None tried Associated symptoms: vomiting   Associated symptoms: no blood in stool, no chest pain, no diarrhea, no headaches, no nausea, no palpitations, no shortness of breath, no vision changes and no weakness   Emesis Associated symptoms: no abdominal pain, no arthralgias, no chills, no cough, no diarrhea, no fever, no headaches and no sore throat        Past Medical History:  Diagnosis Date  . Allergy   . Cancer ([place])    skin  . Diabetes mellitus   . Hypertension     There are no problems to display for this patient.   Past Surgical History:  Procedure Laterality Date  . CESAREAN SECTION    . 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     Family History  Problem Relation Age of Onset  . Cancer Sister        1 HAS  . Hypertension Mother   . Cancer Mother        BREAST  . Hypertension Father     Social History   Tobacco Use  . Smoking status: Never Smoker  . Smokeless tobacco: Never Used  Vaping Use  . Vaping Use: Never used  Substance Use Topics  . Alcohol use: No  . Drug use: No    Home Medications Prior to Admission medications   Medication Sig Start Date End Date Taking? Authorizing Provider  blood glucose meter kit and supplies KIT Dispense based on patient and insurance preference. Use up to four times daily as directed. (FOR ICD-9 250.00, 250.01). 07/01/19   Isla Pence, MD  cyclobenzaprine (FLEXERIL) 10 MG tablet Take 1 tablet (10 mg total) by mouth 2 (two) times daily as needed for muscle spasms. Patient not taking: Reported on 07/01/2019 01/16/14   Teressa Lower, MD  HYDROcodone-acetaminophen (NORCO/VICODIN) 5-325 MG per tablet Take 1-2 tablets by mouth every 4 (four) hours as needed. Patient not taking: Reported on 07/01/2019 01/16/14   Teressa Lower, MD  ibuprofen (ADVIL,MOTRIN) 600 MG tablet Take 1 tablet (600 mg total) by mouth  every 6 (six) hours as needed. Patient not taking: Reported on 07/01/2019 01/16/14   Teressa Lower, MD  lisinopril (ZESTRIL) 10 MG tablet Take 1 tablet (10 mg total) by mouth daily. 07/01/19   Isla Pence, MD  metFORMIN (GLUCOPHAGE) 500 MG tablet Take 1 tablet (500 mg total) by mouth 2 (two) times daily with a meal. 07/01/19   Isla Pence, MD    Allergies    Food, Morphine and related, and Penicillins  Review of Systems   Review of Systems  Constitutional: Negative for chills and fever.  HENT: Negative for ear pain and sore throat.   Eyes: Negative for pain and visual disturbance.  Respiratory: Negative for cough and shortness of breath.   Cardiovascular: Negative for chest pain and  palpitations.  Gastrointestinal: Positive for vomiting. Negative for abdominal pain, blood in stool, diarrhea and nausea.  Genitourinary: Negative for dysuria and hematuria.  Musculoskeletal: Negative for arthralgias and back pain.  Skin: Negative for color change and rash.  Neurological: Positive for dizziness. Negative for seizures, syncope, weakness and headaches.  All other systems reviewed and are negative.   Physical Exam Updated Vital Signs BP (!) 209/101 (BP Location: Left Arm)   Pulse 76   Temp (!) 97.5 F (36.4 C) (Oral)   Resp 17   SpO2 98%   Physical Exam Vitals and nursing note reviewed.  Constitutional:      General: She is not in acute distress.    Appearance: She is well-developed.  HENT:     Head: Normocephalic and atraumatic.     Nose: Nose normal.     Mouth/Throat:     Mouth: Mucous membranes are moist.  Eyes:     Extraocular Movements: Extraocular movements intact.     Conjunctiva/sclera: Conjunctivae normal.     Pupils: Pupils are equal, round, and reactive to light.  Cardiovascular:     Rate and Rhythm: Normal rate and regular rhythm.     Heart sounds: No murmur heard.   Pulmonary:     Effort: Pulmonary effort is normal. No respiratory distress.     Breath sounds: Normal breath sounds.  Abdominal:     Palpations: Abdomen is soft.     Tenderness: There is no abdominal tenderness.  Musculoskeletal:     Cervical back: Neck supple.  Skin:    General: Skin is warm and dry.  Neurological:     Mental Status: She is alert and oriented to person, place, and time.     Cranial Nerves: No cranial nerve deficit.     Sensory: No sensory deficit.     Motor: No weakness.     Coordination: Coordination normal.  Psychiatric:        Mood and Affect: Mood normal.        Behavior: Behavior normal.     ED Results / Procedures / Treatments   Labs (all labs ordered are listed, but only abnormal results are displayed) Labs Reviewed  BASIC METABOLIC PANEL -  Abnormal; Notable for the following components:      Result Value   Glucose, Bld 364 (*)    All other components within normal limits  CBG MONITORING, ED - Abnormal; Notable for the following components:   Glucose-Capillary 354 (*)    All other components within normal limits  CBC WITH DIFFERENTIAL/PLATELET    EKG EKG Interpretation  Date/Time:  Saturday Apr 07 2021 19:00:46 EDT Ventricular Rate:  68 PR Interval:  148 QRS Duration: 86 QT Interval:  414 QTC Calculation:  441 R Axis:   -18 Text Interpretation: Sinus rhythm Borderline left axis deviation Nonspecific T abnormalities, lateral leads No acute ischemia Confirmed by Lorre Munroe ([id number]) on 04/07/2021 8:18:46 PM   Radiology CT Head Wo Contrast  Result Date: 04/07/2021 CLINICAL DATA:  Headache with nausea vomiting. History of hypertension. EXAM: CT HEAD WITHOUT CONTRAST TECHNIQUE: Contiguous axial images were obtained from the base of the skull through the vertex without intravenous contrast. COMPARISON:  None. FINDINGS: Brain: No evidence of acute infarction, hemorrhage, hydrocephalus, extra-axial collection or mass lesion/mass effect. Small focal hypoattenuation in right thalamus consistent with an old lacunar infarct. Small areas of white matter hypoattenuation in the frontal lobes bilaterally consistent with ischemic change. Vascular: No hyperdense vessel or unexpected calcification. Skull: Normal. Negative for fracture or focal lesion. Sinuses/Orbits: Normal globes and orbits. Visualized sinuses are clear. Other: None. IMPRESSION: 1. No acute intracranial abnormalities. Electronically Signed   By: Lajean Manes M.D.   On: 04/07/2021 17:24    Procedures Procedures   Medications Ordered in ED Medications  lisinopril (ZESTRIL) tablet 10 mg (has no administration in time range)  sodium chloride 0.9 % bolus 1,000 mL (1,000 mLs Intravenous New Bag/Given 04/07/21 1848)    ED Course  I have reviewed the triage vital signs and the  nursing notes.  Pertinent labs & imaging results that were available during my care of the patient were reviewed by me and considered in my medical decision making (see chart for details).  I had a long conversation with this patient.  I recommended that we go ahead with an MRI because she has risk factors for stroke and has the presence of lacunar infarct seen on CT scan.  Neurologic exam was normal, but she still has dizziness.  She states that she has had 1 prior MRI, and she had a panic attack because she thought she was in a coffin.  She was unable to complete the exam.  She states that she is unwilling to complete this study even with premedication.    MDM Rules/Calculators/A&P                          Charda Newburn presented with dizziness, nausea, and vomiting.  She was noted to be mildly hyperglycemic and hypertensive which are in keeping with her history of not having her medication.  She was evaluated for evidence of central vertigo.  Neurologic exam was normal, but there were features that made me concerned about a potential central cause.  As documented above, she would not consent to MRI.  At discharge, and after IV fluids, she was feeling better.  She will be started on secondary stroke prevention measures such as aspirin and Lipitor.  She has agreed to take her metformin and lisinopril, and I have prescribed these for her.  She was given careful return precautions and instructions to follow-up with her primary care doctor. Final Clinical Impression(s) / ED Diagnoses Final diagnoses:  Dizziness  Lacunar infarct, acute ([place])  Primary hypertension  Hyperglycemia due to diabetes mellitus ([place])    Rx / DC Orders ED Discharge Orders         Ordered    metFORMIN (GLUCOPHAGE) 500 MG tablet  2 times daily with meals        04/07/21 2017    lisinopril (ZESTRIL) 10 MG tablet  Daily        04/07/21 2017    aspirin 81 MG chewable tablet  Daily  04/07/21 2017    atorvastatin  (LIPITOR) 40 MG tablet  Daily        04/07/21 2017           Arnaldo Natal, MD 04/07/21 2020

## 2021-04-07 NOTE — ED Triage Notes (Signed)
Patient reports she was getting out of car to go into church and felt dizzy, threw up. Her CBG was checked and it was 362. Patient reports she is still feeling dizzy. History of dm2, oral medications. hasnt taken medication as she ran out three days ago.

## 2021-04-07 NOTE — ED Notes (Signed)
Provider at bedside

## 2021-06-03 ENCOUNTER — Encounter (HOSPITAL_COMMUNITY): Payer: Self-pay

## 2021-06-03 ENCOUNTER — Emergency Department (HOSPITAL_COMMUNITY)
Admission: EM | Admit: 2021-06-03 | Discharge: 2021-06-03 | Disposition: A | Discharge status: Home / Self Care | Payer: Medicaid Other | Attending: Emergency Medicine | Admitting: Emergency Medicine

## 2021-06-03 ENCOUNTER — Other Ambulatory Visit: Payer: Self-pay

## 2021-06-03 DIAGNOSIS — Z85828 Personal history of other malignant neoplasm of skin: Secondary | ICD-10-CM | POA: Insufficient documentation

## 2021-06-03 DIAGNOSIS — I10 Essential (primary) hypertension: Secondary | ICD-10-CM | POA: Diagnosis not present

## 2021-06-03 DIAGNOSIS — Z7984 Long term (current) use of oral hypoglycemic drugs: Secondary | ICD-10-CM | POA: Diagnosis not present

## 2021-06-03 DIAGNOSIS — K649 Unspecified hemorrhoids: Secondary | ICD-10-CM | POA: Insufficient documentation

## 2021-06-03 DIAGNOSIS — Z79899 Other long term (current) drug therapy: Secondary | ICD-10-CM | POA: Diagnosis not present

## 2021-06-03 DIAGNOSIS — K5641 Fecal impaction: Secondary | ICD-10-CM

## 2021-06-03 DIAGNOSIS — E119 Type 2 diabetes mellitus without complications: Secondary | ICD-10-CM | POA: Diagnosis not present

## 2021-06-03 DIAGNOSIS — K5900 Constipation, unspecified: Secondary | ICD-10-CM | POA: Diagnosis present

## 2021-06-03 MED ORDER — MINERAL OIL RE ENEM
1.0000 | ENEMA | Freq: Once | RECTAL | Status: DC
  Filled 2021-06-03: qty 1

## 2021-06-03 NOTE — ED Provider Notes (Signed)
Emergency Medicine Provider Triage Evaluation Note  Madison Long , a 49 y.o. female  was evaluated in triage.  Pt complains of constipation x 1 week and hemorrhoids over past few days. Very painful. Alleviated some with warm compresses.   Review of Systems  Positive: Constipation, hemorrhoids Negative: Fever, vomiting  Physical Exam  BP (!) 173/116 (BP Location: Right Arm)   Pulse 91   Temp 98.2 F (36.8 C) (Oral)   Resp 16   SpO2 99%  Gen:   Awake, no distress   Resp:  Normal effort  MSK:   Moves extremities without difficulty  Other:  Abdomen: No focal tenderness  Medical Decision Making  Medically screening exam initiated at 11:24 AM.  Appropriate orders placed.  Madison Long was informed that the remainder of the evaluation will be completed by another provider, this initial triage assessment does not replace that evaluation, and the importance of remaining in the ED until their evaluation is complete.  Constipation.    Leafy Kindle 06/03/21 1125    Valarie Merino, MD 06/03/21 1409

## 2021-06-03 NOTE — ED Triage Notes (Signed)
Pt c/o constipation and worsening hemorrhoids x 1 week. Pain score 8/10.   Pt reports using OTC medications w/o relief.

## 2021-06-03 NOTE — Discharge Instructions (Addendum)
Get MiraLAX from over-the-counter as the pharmacist if you have any questions.  Start with 1 scoop in 8 ounces of liquid and if you do not have a bowel movement in 6 hours due to scoops in 8 ounces of liquid and if you do not have a bowel movement in 6 hours to 3 scoops in 8 ounces and if you still do not have a bowel movement do 4 scoops in 8 ounces.  After that you most likely will have a bowel movement.  Make sure you are drinking plenty of fluids to stay hydrated which will also help with the constipation.

## 2021-06-03 NOTE — ED Provider Notes (Signed)
[facility] DEPT Provider Note   CSN: [id number] Arrival date & time: 06/03/21  1052     History Chief Complaint  Patient presents with   Constipation   Hemorrhoids    Cabella Ambrocio is a 50 y.o. female.  Patient is a 50 year old female with a history of hypertension and diabetes presenting today with complaint of severe rectal pain.  Patient reports that she recently started on metformin and Januvia and last week had diarrhea all week and then started having constipation has been unable to have a bowel movement in approximately 7 days.  She sits on the toilet and has severe pain in the rectum but nothing will come out.  She felt like she might of had hemorrhoids so she has been using Preparation H, sitz baths, warm washcloths but nothing is helping.  She has been drinking water and still eating without nausea or vomiting.  She has not taken anything for the constipation because she is not sure what she should take.  She has not had fever.  She has had some abdominal distention but denies any abdominal pain.  There is been no rectal bleeding.  No similar symptoms in the past.  The history is provided by the patient.  Constipation Severity:  Severe     Past Medical History:  Diagnosis Date   Allergy    Cancer ([place])    skin   Diabetes mellitus    Hypertension     There are no problems to display for this patient.   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     Family History  Problem Relation Age of Onset   Cancer Sister        1 HAS   Hypertension Mother    Cancer Mother        BREAST   Hypertension Father     Social History   Tobacco Use   Smoking status: Never   Smokeless tobacco: Never  Vaping Use   Vaping Use: Never used  Substance Use Topics   Alcohol use: No   Drug use: No    Home Medications Prior to  Admission medications   Medication Sig Start Date End Date Taking? Authorizing Provider  atorvastatin (LIPITOR) 40 MG tablet Take 1 tablet (40 mg total) by mouth daily. 04/07/21   Arnaldo Natal, MD  blood glucose meter kit and supplies KIT Dispense based on patient and insurance preference. Use up to four times daily as directed. (FOR ICD-9 250.00, 250.01). 07/01/19   Isla Pence, MD  cyclobenzaprine (FLEXERIL) 10 MG tablet Take 1 tablet (10 mg total) by mouth 2 (two) times daily as needed for muscle spasms. Patient not taking: Reported on 07/01/2019 01/16/14   Teressa Lower, MD  HYDROcodone-acetaminophen (NORCO/VICODIN) 5-325 MG per tablet Take 1-2 tablets by mouth every 4 (four) hours as needed. Patient not taking: Reported on 07/01/2019 01/16/14   Teressa Lower, MD  ibuprofen (ADVIL,MOTRIN) 600 MG tablet Take 1 tablet (600 mg total) by mouth every 6 (six) hours as needed. Patient not taking: Reported on 07/01/2019 01/16/14   Teressa Lower, MD  lisinopril (ZESTRIL) 10 MG tablet Take 1 tablet (10 mg total) by mouth daily. 04/07/21   Arnaldo Natal, MD  metFORMIN (GLUCOPHAGE) 500 MG tablet Take 1 tablet (500 mg total) by mouth 2 (two)  times daily with a meal. 04/07/21   Wright, Anna G, MD    Allergies    Food, Morphine and related, and Penicillins  Review of Systems   Review of Systems  Gastrointestinal:  Positive for constipation.  All other systems reviewed and are negative.  Physical Exam Updated Vital Signs BP (!) 159/107   Pulse 94   Temp 98.2 F (36.8 C) (Oral)   Resp 16   LMP 05/13/2021   SpO2 97%   Physical Exam Vitals and nursing note reviewed.  Constitutional:      General: She is not in acute distress.    Appearance: She is well-developed.  HENT:     Head: Normocephalic and atraumatic.  Eyes:     Pupils: Pupils are equal, round, and reactive to light.  Cardiovascular:     Rate and Rhythm: Normal rate and regular rhythm.     Heart sounds: Normal heart sounds. No murmur  heard.   No friction rub.  Pulmonary:     Effort: Pulmonary effort is normal.     Breath sounds: Normal breath sounds. No wheezing or rales.  Abdominal:     General: Bowel sounds are normal. There is no distension.     Palpations: Abdomen is soft.     Tenderness: There is no abdominal tenderness. There is no guarding or rebound.  Genitourinary:    Rectum: No anal fissure or external hemorrhoid.     Comments: Fecal impaction noted with large amount of firm brown stool Musculoskeletal:        General: No tenderness. Normal range of motion.     Comments: No edema  Skin:    General: Skin is warm and dry.     Findings: No rash.  Neurological:     Mental Status: She is alert and oriented to person, place, and time. Mental status is at baseline.     Cranial Nerves: No cranial nerve deficit.  Psychiatric:        Mood and Affect: Mood normal.        Behavior: Behavior normal.    ED Results / Procedures / Treatments   Labs (all labs ordered are listed, but only abnormal results are displayed) Labs Reviewed - No data to display  EKG None  Radiology No results found.  Procedures Procedures   Medications Ordered in ED Medications  mineral oil enema 1 enema (1 enema Rectal Not Given 06/03/21 2109)    ED Course  I have reviewed the triage vital signs and the nursing notes.  Pertinent labs & imaging results that were available during my care of the patient were reviewed by me and considered in my medical decision making (see chart for details).    MDM Rules/Calculators/A&P                           Well-appearing 50-year-old female presenting today for fecal impaction.  She has no evidence of hemorrhoids, perirectal abscess and no bleeding on exam.  Patient was manually disimpacted with a large amount of stool.  She was going to get a mineral oil enema however she reports that she has to leave.  Significant amount of her pain is improved.  We will have patient use MiraLAX for  the constipation.  No evidence to suggest obstruction at this time.  Final Clinical Impression(s) / ED Diagnoses Final diagnoses:  Fecal impaction ([facility])    Rx / DC Orders ED Discharge Orders       None        Plunkett, Whitney, MD 06/03/21 2114  

## 2021-06-05 ENCOUNTER — Encounter (HOSPITAL_COMMUNITY): Payer: Self-pay

## 2021-06-05 ENCOUNTER — Other Ambulatory Visit: Payer: Self-pay

## 2021-06-05 ENCOUNTER — Emergency Department (HOSPITAL_COMMUNITY)
Admission: EM | Admit: 2021-06-05 | Discharge: 2021-06-06 | Disposition: A | Discharge status: Home / Self Care | Payer: Medicaid Other | Attending: Emergency Medicine | Admitting: Emergency Medicine

## 2021-06-05 DIAGNOSIS — I10 Essential (primary) hypertension: Secondary | ICD-10-CM | POA: Diagnosis not present

## 2021-06-05 DIAGNOSIS — Z79899 Other long term (current) drug therapy: Secondary | ICD-10-CM | POA: Insufficient documentation

## 2021-06-05 DIAGNOSIS — Z85820 Personal history of malignant melanoma of skin: Secondary | ICD-10-CM | POA: Insufficient documentation

## 2021-06-05 DIAGNOSIS — K5641 Fecal impaction: Secondary | ICD-10-CM | POA: Insufficient documentation

## 2021-06-05 DIAGNOSIS — Z7984 Long term (current) use of oral hypoglycemic drugs: Secondary | ICD-10-CM | POA: Diagnosis not present

## 2021-06-05 DIAGNOSIS — E119 Type 2 diabetes mellitus without complications: Secondary | ICD-10-CM | POA: Diagnosis not present

## 2021-06-05 DIAGNOSIS — K5900 Constipation, unspecified: Secondary | ICD-10-CM | POA: Diagnosis present

## 2021-06-05 LAB — COMPREHENSIVE METABOLIC PANEL
ALT: 23 U/L (ref 0–44)
AST: 22 U/L (ref 15–41)
Albumin: 4.3 g/dL (ref 3.5–5.0)
Alkaline Phosphatase: 97 U/L (ref 38–126)
Anion gap: 12 (ref 5–15)
BUN: 18 mg/dL (ref 6–20)
CO2: 20 mmol/L — ABNORMAL LOW (ref 22–32)
Calcium: 9.1 mg/dL (ref 8.9–10.3)
Chloride: 104 mmol/L (ref 98–111)
Creatinine, Ser: 0.8 mg/dL (ref 0.44–1.00)
GFR, Estimated: >60 mL/min (ref >60)
Glucose, Bld: 123 mg/dL — ABNORMAL HIGH (ref 70–99)
Potassium: 4.1 mmol/L (ref 3.5–5.1)
Sodium: 136 mmol/L (ref 135–145)
Total Bilirubin: 1.5 mg/dL — ABNORMAL HIGH (ref 0.3–1.2)
Total Protein: 7.9 g/dL (ref 6.5–8.1)

## 2021-06-05 LAB — CBC
HCT: 43.6 % (ref 36.0–46.0)
Hemoglobin: 14.4 g/dL (ref 12.0–15.0)
MCH: 28.3 pg (ref 26.0–34.0)
MCHC: 33 g/dL (ref 30.0–36.0)
MCV: 85.8 fL (ref 80.0–100.0)
Platelets: 317 10*3/uL (ref 150–400)
RBC: 5.08 MIL/uL (ref 3.87–5.11)
RDW: 12.6 % (ref 11.5–15.5)
WBC: 8.8 10*3/uL (ref 4.0–10.5)
nRBC: 0 % (ref 0.0–0.2)

## 2021-06-05 LAB — I-STAT BETA HCG BLOOD, ED (MC, WL, AP ONLY): I-stat hCG, quantitative: <5 m[IU]/mL (ref <5)

## 2021-06-05 NOTE — ED Triage Notes (Signed)
Patient left the ED at approx 1516 and went to her PCP. Patient told the EMT first that her doctor sent her back to the ED.

## 2021-06-05 NOTE — ED Provider Notes (Signed)
[facility] DEPT Provider Note   CSN: [id number] Arrival date & time: 06/05/21  0856     History Chief Complaint  Patient presents with   Constipation    Madison Long is a 50 y.o. female.  50 yo F with a chief complaints of constipation.  Patient states she has not had a bowel movement in about 9 days.  Came to the emergency department yesterday and had a disimpaction performed.  Went to urgent care today and they told her that her x-ray showed that she was full of stool and she still not able to have a bowel movement sent back to the ED for evaluation.  No nausea no vomiting no abdominal pain.  No fevers.  Recently started 2 new medications at the onset of her symptoms.  Feels like she may have hemorrhoids.  States that she bears down and nothing seems to come out.  She tried a couple doses of MiraLAX without improvement.  The history is provided by the patient.  Constipation Associated symptoms: no abdominal pain, no dysuria, no fever, no nausea and no vomiting   Illness Severity:  Moderate Onset quality:  Gradual Duration:  10 days Timing:  Constant Progression:  Worsening Chronicity:  New Associated symptoms: no abdominal pain, no chest pain, no congestion, no fever, no headaches, no myalgias, no nausea, no rhinorrhea, no shortness of breath, no vomiting and no wheezing       Past Medical History:  Diagnosis Date   Allergy    Cancer ([place])    skin   Diabetes mellitus    Hypertension     There are no problems to display for this patient.   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     Family History  Problem Relation Age of Onset   Cancer Sister        1 HAS   Hypertension Mother    Cancer Mother        BREAST   Hypertension Father     Social History   Tobacco Use   Smoking status: Never   Smokeless tobacco:  Never  Vaping Use   Vaping Use: Never used  Substance Use Topics   Alcohol use: No   Drug use: No    Home Medications Prior to Admission medications   Medication Sig Start Date End Date Taking? Authorizing Provider  atorvastatin (LIPITOR) 40 MG tablet Take 1 tablet (40 mg total) by mouth daily. 04/07/21   Arnaldo Natal, MD  blood glucose meter kit and supplies KIT Dispense based on patient and insurance preference. Use up to four times daily as directed. (FOR ICD-9 250.00, 250.01). 07/01/19   Isla Pence, MD  cyclobenzaprine (FLEXERIL) 10 MG tablet Take 1 tablet (10 mg total) by mouth 2 (two) times daily as needed for muscle spasms. Patient not taking: Reported on 07/01/2019 01/16/14   Teressa Lower, MD  HYDROcodone-acetaminophen (NORCO/VICODIN) 5-325 MG per tablet Take 1-2 tablets by mouth every 4 (four) hours as needed. Patient not taking: Reported on 07/01/2019 01/16/14   Teressa Lower, MD  ibuprofen (ADVIL,MOTRIN) 600 MG tablet Take 1 tablet (600 mg total) by mouth every 6 (six) hours as needed. Patient not taking: Reported on 07/01/2019 01/16/14   Teressa Lower, MD  lisinopril (ZESTRIL) 10 MG tablet Take 1 tablet (10 mg total)  by mouth daily. 04/07/21   Arnaldo Natal, MD  metFORMIN (GLUCOPHAGE) 500 MG tablet Take 1 tablet (500 mg total) by mouth 2 (two) times daily with a meal. 04/07/21   Arnaldo Natal, MD    Allergies    Food, Morphine and related, and Penicillins  Review of Systems   Review of Systems  Constitutional:  Negative for chills and fever.  HENT:  Negative for congestion and rhinorrhea.   Eyes:  Negative for redness and visual disturbance.  Respiratory:  Negative for shortness of breath and wheezing.   Cardiovascular:  Negative for chest pain and palpitations.  Gastrointestinal:  Positive for constipation. Negative for abdominal pain, nausea and vomiting.  Genitourinary:  Negative for dysuria and urgency.  Musculoskeletal:  Negative for arthralgias and myalgias.  Skin:   Negative for pallor and wound.  Neurological:  Negative for dizziness and headaches.   Physical Exam Updated Vital Signs BP (!) 210/109 (BP Location: Left Arm)   Pulse (!) 124   Temp 99.5 F (37.5 C)   Resp (!) 22   Ht 5' 7"  (1.702 m)   Wt 117.9 kg   LMP 05/13/2021   SpO2 98%   BMI 40.72 kg/m   Physical Exam Vitals and nursing note reviewed.  Constitutional:      General: She is not in acute distress.    Appearance: She is well-developed. She is not diaphoretic.  HENT:     Head: Normocephalic and atraumatic.  Eyes:     Pupils: Pupils are equal, round, and reactive to light.  Cardiovascular:     Rate and Rhythm: Normal rate and regular rhythm.     Heart sounds: No murmur heard.   No friction rub. No gallop.  Pulmonary:     Effort: Pulmonary effort is normal.     Breath sounds: No wheezing or rales.  Abdominal:     General: There is no distension.     Palpations: Abdomen is soft.     Tenderness: There is no abdominal tenderness.  Genitourinary:    Comments: No hemorrhoids, large amount of clay colored stool in the rectum. Musculoskeletal:        General: No tenderness.     Cervical back: Normal range of motion and neck supple.  Skin:    General: Skin is warm and dry.  Neurological:     Mental Status: She is alert and oriented to person, place, and time.  Psychiatric:        Behavior: Behavior normal.    ED Results / Procedures / Treatments   Labs (all labs ordered are listed, but only abnormal results are displayed) Labs Reviewed  COMPREHENSIVE METABOLIC PANEL - Abnormal; Notable for the following components:      Result Value   CO2 20 (*)    Glucose, Bld 123 (*)    Total Bilirubin 1.5 (*)    All other components within normal limits  CBC  URINALYSIS, ROUTINE W REFLEX MICROSCOPIC  I-STAT BETA HCG BLOOD, ED ([facility], [facility], [facility] ONLY)    EKG None  Radiology No results found.  Procedures Fecal disimpaction  Date/Time: 06/05/2021 11:51 PM Performed by:  Deno Etienne, DO Authorized by: Deno Etienne, DO  Consent: Verbal consent obtained. Consent given by: patient Patient understanding: patient states understanding of the procedure being performed Patient consent: the patient's understanding of the procedure matches consent given Imaging studies: imaging studies not available Patient identity confirmed: verbally with patient Time out: Immediately prior to procedure a "time out" was  called to verify the correct patient, procedure, equipment, support staff and site/side marked as required. Preparation: Patient was prepped and draped in the usual sterile fashion. Local anesthesia used: no  Anesthesia: Local anesthesia used: no  Sedation: Patient sedated: no  Patient tolerance: patient tolerated the procedure well with no immediate complications     Medications Ordered in ED Medications - No data to display  ED Course  I have reviewed the triage vital signs and the nursing notes.  Pertinent labs & imaging results that were available during my care of the patient were reviewed by me and considered in my medical decision making (see chart for details).    MDM Rules/Calculators/A&P                           50 yo F with a chief complaint of constipation.  Going on for about 10 days now.  Fecal impaction clinically.  We will have her increase her dose of MiraLAX.  Fleets enema.  PCP follow-up.  11:52 PM:  I have discussed the diagnosis/risks/treatment options with the patient and believe the pt to be eligible for discharge home to follow-up with PCP. We also discussed returning to the ED immediately if new or worsening sx occur. We discussed the sx which are most concerning (e.g., sudden worsening pain, fever, inability to tolerate by mouth) that necessitate immediate return. Medications administered to the patient during their visit and any new prescriptions provided to the patient are listed below.  Medications given during this  visit Medications - No data to display   The patient appears reasonably screen and/or stabilized for discharge and I doubt any other medical condition or other [facility] requiring further screening, evaluation, or treatment in the ED at this time prior to discharge.   Final Clinical Impression(s) / ED Diagnoses Final diagnoses:  Fecal impaction in rectum [facility])    Rx / DC Orders ED Discharge Orders     None        Deno Etienne, DO 06/05/21 2352

## 2021-06-05 NOTE — Discharge Instructions (Addendum)
Take 8 scoops of miralax in 32oz of whatever you would like to drink.(Gatorade comes in this size) You can also use a fleets enema which you can buy over the counter at the pharmacy.  Return for worsening abdominal pain, vomiting or fever. ? ?

## 2021-06-05 NOTE — ED Provider Notes (Signed)
Emergency Medicine Provider Triage Evaluation Note  Madison Long , a 50 y.o. female  was evaluated in triage.  Pt complains of constipation x6 days.  Was seen in the ED 2 days ago where she had a fecal disimpaction and was started on MiraLAX.  She is making 3 doses of MiraLAX for the last 2 days without improvement.  She has tried to disimpact herself with some relief but not much.  No nausea or vomiting, abdominal pain.  Review of Systems  Positive: Constipation Negative: Nausea, vomiting  Physical Exam  BP (!) 172/105 (BP Location: Left Arm)   Pulse (!) 108   Temp 98.1 F (36.7 C) (Oral)   Ht '5\' 7"'$  (1.702 m)   Wt 117.9 kg   LMP 05/13/2021   SpO2 100%   BMI 40.72 kg/m  Gen:   Awake, no distress   Resp:  Normal effort  MSK:   Moves extremities without difficulty  Other:  No abdominal tenderness  Medical Decision Making  Medically screening exam initiated at 10:49 AM.  Appropriate orders placed.  Alveda Calleros was informed that the remainder of the evaluation will be completed by another provider, this initial triage assessment does not replace that evaluation, and the importance of remaining in the ED until their evaluation is complete.     Sherrill Raring, PA-C 06/05/21 [place], Nathan, MD 06/05/21 [phone]

## 2021-06-05 NOTE — ED Triage Notes (Signed)
Patient states it has been approx 9 days since having a stool. Patient states she tried to digitally remove the stool without success. Patient states she has been using miralax with no success.

## 2021-06-06 LAB — URINALYSIS, ROUTINE W REFLEX MICROSCOPIC
Bacteria, UA: NONE SEEN
Bilirubin Urine: NEGATIVE
Glucose, UA: >=500 mg/dL — AB
Hgb urine dipstick: NEGATIVE
Ketones, ur: 20 mg/dL — AB
Leukocytes,Ua: NEGATIVE
Nitrite: NEGATIVE
Protein, ur: NEGATIVE mg/dL
Specific Gravity, Urine: 1.036 — ABNORMAL HIGH (ref 1.005–1.030)
pH: 5 (ref 5.0–8.0)

## 2022-02-01 ENCOUNTER — Other Ambulatory Visit: Payer: Self-pay

## 2022-02-01 ENCOUNTER — Emergency Department (HOSPITAL_COMMUNITY)
Admission: EM | Admit: 2022-02-01 | Discharge: 2022-02-01 | Disposition: A | Discharge status: Home / Self Care | Payer: Medicaid Other | Attending: Emergency Medicine | Admitting: Emergency Medicine

## 2022-02-01 ENCOUNTER — Emergency Department (HOSPITAL_COMMUNITY): Payer: Medicaid Other

## 2022-02-01 ENCOUNTER — Encounter (HOSPITAL_COMMUNITY): Payer: Self-pay

## 2022-02-01 DIAGNOSIS — E119 Type 2 diabetes mellitus without complications: Secondary | ICD-10-CM | POA: Diagnosis not present

## 2022-02-01 DIAGNOSIS — Z7984 Long term (current) use of oral hypoglycemic drugs: Secondary | ICD-10-CM | POA: Diagnosis not present

## 2022-02-01 DIAGNOSIS — R42 Dizziness and giddiness: Secondary | ICD-10-CM | POA: Diagnosis present

## 2022-02-01 DIAGNOSIS — I10 Essential (primary) hypertension: Secondary | ICD-10-CM | POA: Insufficient documentation

## 2022-02-01 DIAGNOSIS — R791 Abnormal coagulation profile: Secondary | ICD-10-CM | POA: Diagnosis not present

## 2022-02-01 DIAGNOSIS — Z20822 Contact with and (suspected) exposure to covid-19: Secondary | ICD-10-CM | POA: Insufficient documentation

## 2022-02-01 DIAGNOSIS — Z79899 Other long term (current) drug therapy: Secondary | ICD-10-CM | POA: Insufficient documentation

## 2022-02-01 LAB — CBC
HCT: 42.4 % (ref 36.0–46.0)
Hemoglobin: 14.3 g/dL (ref 12.0–15.0)
MCH: 28.7 pg (ref 26.0–34.0)
MCHC: 33.7 g/dL (ref 30.0–36.0)
MCV: 85 fL (ref 80.0–100.0)
Platelets: 322 10*3/uL (ref 150–400)
RBC: 4.99 MIL/uL (ref 3.87–5.11)
RDW: 13.5 % (ref 11.5–15.5)
WBC: 7 10*3/uL (ref 4.0–10.5)
nRBC: 0 % (ref 0.0–0.2)

## 2022-02-01 LAB — URINALYSIS, ROUTINE W REFLEX MICROSCOPIC
Bilirubin Urine: NEGATIVE
Glucose, UA: 150 mg/dL — AB
Hgb urine dipstick: NEGATIVE
Ketones, ur: NEGATIVE mg/dL
Leukocytes,Ua: NEGATIVE
Nitrite: NEGATIVE
Protein, ur: NEGATIVE mg/dL
Specific Gravity, Urine: 1.008 (ref 1.005–1.030)
pH: 6 (ref 5.0–8.0)

## 2022-02-01 LAB — DIFFERENTIAL
Abs Immature Granulocytes: 0.02 10*3/uL (ref 0.00–0.07)
Basophils Absolute: 0 10*3/uL (ref 0.0–0.1)
Basophils Relative: 0 %
Eosinophils Absolute: 0.1 10*3/uL (ref 0.0–0.5)
Eosinophils Relative: 1 %
Immature Granulocytes: 0 %
Lymphocytes Relative: 32 %
Lymphs Abs: 2.2 10*3/uL (ref 0.7–4.0)
Monocytes Absolute: 0.4 10*3/uL (ref 0.1–1.0)
Monocytes Relative: 6 %
Neutro Abs: 4.3 10*3/uL (ref 1.7–7.7)
Neutrophils Relative %: 61 %

## 2022-02-01 LAB — COMPREHENSIVE METABOLIC PANEL
ALT: 16 U/L (ref 0–44)
AST: 15 U/L (ref 15–41)
Albumin: 4.1 g/dL (ref 3.5–5.0)
Alkaline Phosphatase: 87 U/L (ref 38–126)
Anion gap: 8 (ref 5–15)
BUN: 19 mg/dL (ref 6–20)
CO2: 25 mmol/L (ref 22–32)
Calcium: 9.2 mg/dL (ref 8.9–10.3)
Chloride: 100 mmol/L (ref 98–111)
Creatinine, Ser: 0.71 mg/dL (ref 0.44–1.00)
GFR, Estimated: >60 mL/min (ref >60)
Glucose, Bld: 262 mg/dL — ABNORMAL HIGH (ref 70–99)
Potassium: 3.7 mmol/L (ref 3.5–5.1)
Sodium: 133 mmol/L — ABNORMAL LOW (ref 135–145)
Total Bilirubin: 0.9 mg/dL (ref 0.3–1.2)
Total Protein: 7.7 g/dL (ref 6.5–8.1)

## 2022-02-01 LAB — RAPID URINE DRUG SCREEN, HOSP PERFORMED
Amphetamines: NOT DETECTED
Barbiturates: NOT DETECTED
Benzodiazepines: NOT DETECTED
Cocaine: NOT DETECTED
Opiates: NOT DETECTED
Tetrahydrocannabinol: NOT DETECTED

## 2022-02-01 LAB — ETHANOL: Alcohol, Ethyl (B): <10 mg/dL (ref <10)

## 2022-02-01 LAB — APTT: aPTT: 27 seconds (ref 24–36)

## 2022-02-01 LAB — PROTIME-INR
INR: 1.1 (ref 0.8–1.2)
Prothrombin Time: 14.4 seconds (ref 11.4–15.2)

## 2022-02-01 LAB — I-STAT BETA HCG BLOOD, ED (MC, WL, AP ONLY): I-stat hCG, quantitative: <5 m[IU]/mL (ref <5)

## 2022-02-01 LAB — RESP PANEL BY RT-PCR (FLU A&B, COVID) ARPGX2
Influenza A by PCR: NEGATIVE
Influenza B by PCR: NEGATIVE
SARS Coronavirus 2 by RT PCR: NEGATIVE

## 2022-02-01 LAB — I-STAT CHEM 8, ED
BUN: 16 mg/dL (ref 6–20)
Calcium, Ion: 1.26 mmol/L (ref 1.15–1.40)
Chloride: 101 mmol/L (ref 98–111)
Creatinine, Ser: 0.7 mg/dL (ref 0.44–1.00)
Glucose, Bld: 270 mg/dL — ABNORMAL HIGH (ref 70–99)
HCT: 42 % (ref 36.0–46.0)
Hemoglobin: 14.3 g/dL (ref 12.0–15.0)
Potassium: 3.9 mmol/L (ref 3.5–5.1)
Sodium: 138 mmol/L (ref 135–145)
TCO2: 26 mmol/L (ref 22–32)

## 2022-02-01 MED ORDER — LISINOPRIL 10 MG PO TABS
10.0000 mg | ORAL_TABLET | Freq: Every day | ORAL | 0 refills | Status: AC
Start: 2022-02-01 — End: ?

## 2022-02-01 MED ORDER — METFORMIN HCL 500 MG PO TABS
500.0000 mg | ORAL_TABLET | Freq: Two times a day (BID) | ORAL | 0 refills | Status: AC
Start: 2022-02-01 — End: ?

## 2022-02-01 MED ORDER — LORAZEPAM 2 MG/ML IJ SOLN
1.0000 mg | Freq: Once | INTRAMUSCULAR | Status: AC
  Administered 2022-02-01: 1 mg via INTRAVENOUS
  Filled 2022-02-01: qty 1

## 2022-02-01 MED ORDER — LABETALOL HCL 5 MG/ML IV SOLN: 10.0000 mg | Freq: Once | INTRAVENOUS | Status: DC

## 2022-02-01 MED ORDER — ATORVASTATIN CALCIUM 40 MG PO TABS: 40.0000 mg | ORAL_TABLET | Freq: Every day | ORAL | 0 refills | Status: AC

## 2022-02-01 MED ORDER — LISINOPRIL 10 MG PO TABS
10.0000 mg | ORAL_TABLET | Freq: Once | ORAL | Status: AC
Start: 2022-02-01 — End: 2022-02-01
  Administered 2022-02-01: 10 mg via ORAL
  Filled 2022-02-01: qty 1

## 2022-02-01 NOTE — ED Provider Notes (Signed)
?[facility] DEPT ?Provider Note ? ? ?CSN: [id number] ?Arrival date & time: 02/01/22  0820 ? ?  ? ?History ? ?Chief complaint: Dizziness ? ?Madison Long is a 51 y.o. female. ? ?HPI ? ?Patient states she started having symptoms on Wednesday.  She started to feel dizzy and off-balance.  She did have an episode of nausea and vomiting.  Patient states she continued to have symptoms over the last couple days.  This morning it was worse so she came to the ED.  Patient feels unsteady when she is walking.  She did have 1 episode of blurred vision last evening when she was trying to help her son with his homework but otherwise does not have any trouble with her vision.  She denies any numbness or weakness in arms or legs.  No facial droop.  Patient does have history of high blood pressure and diabetes.  She states that she stopped taking her medications in December.  She decided to try to monitor her diet and exercise regularly.  She stopped these medications on her own and not under the direction of her doctor ? ?Home Medications ?Prior to Admission medications   ?Medication Sig Start Date End Date Taking? Authorizing Provider  ?atorvastatin (LIPITOR) 40 MG tablet Take 1 tablet (40 mg total) by mouth daily. 02/01/22   Dorie Rank, MD  ?blood glucose meter kit and supplies KIT Dispense based on patient and insurance preference. Use up to four times daily as directed. (FOR ICD-9 250.00, 250.01). 07/01/19   Isla Pence, MD  ?lisinopril (ZESTRIL) 10 MG tablet Take 1 tablet (10 mg total) by mouth daily. 02/01/22   Dorie Rank, MD  ?metFORMIN (GLUCOPHAGE) 500 MG tablet Take 1 tablet (500 mg total) by mouth 2 (two) times daily with a meal. 02/01/22   Dorie Rank, MD  ?   ? ?Allergies    ?Food, Morphine and related, and Penicillins   ? ?Review of Systems   ?Review of Systems  ?Constitutional:  Negative for fever.  ?Neurological:  Negative for headaches.  ? ?Physical Exam ?Updated Vital Signs ?BP (!) 184/109  (BP Location: Right Arm)   Pulse 75   Temp 98 ?F (36.7 ?C) (Oral)   Resp (!) 23   Ht 1.702 m (_0 )   Wt 111.1 kg   SpO2 100%   BMI 38.37 kg/m?  ?Physical Exam ?Vitals and nursing note reviewed.  ?Constitutional:   ?   General: She is not in acute distress. ?   Appearance: She is well-developed.  ?HENT:  ?   Head: Normocephalic and atraumatic.  ?   Right Ear: External ear normal.  ?   Left Ear: External ear normal.  ?Eyes:  ?   General: No scleral icterus.    ?   Right eye: No discharge.     ?   Left eye: No discharge.  ?   Conjunctiva/sclera: Conjunctivae normal.  ?Neck:  ?   Trachea: No tracheal deviation.  ?Cardiovascular:  ?   Rate and Rhythm: Normal rate and regular rhythm.  ?Pulmonary:  ?   Effort: Pulmonary effort is normal. No respiratory distress.  ?   Breath sounds: Normal breath sounds. No stridor. No wheezing or rales.  ?Abdominal:  ?   General: Bowel sounds are normal. There is no distension.  ?   Palpations: Abdomen is soft.  ?   Tenderness: There is no abdominal tenderness. There is no guarding or rebound.  ?Musculoskeletal:     ?  General: No tenderness or deformity.  ?   Cervical back: Neck supple.  ?Skin: ?   General: Skin is warm and dry.  ?   Findings: No rash.  ?Neurological:  ?   General: No focal deficit present.  ?   Mental Status: She is alert and oriented to person, place, and time.  ?   Cranial Nerves: No cranial nerve deficit (no facial droop, extraocular movements intact, no slurred speech).  ?   Sensory: No sensory deficit.  ?   Motor: No abnormal muscle tone or seizure activity.  ?   Coordination: Coordination normal.  ?   Comments: No pronator drift bilateral upper extrem, able to hold both legs off bed for 5 seconds, sensation intact in all extremities, no visual field cuts, no left or right sided neglect, normal finger-nose exam bilaterally, no nystagmus noted ? ?No facial droop, extraocular movements intact, tongue midline  ?Psychiatric:     ?   Mood and Affect: Mood  normal.  ? ? ?ED Results / Procedures / Treatments   ?Labs ?(all labs ordered are listed, but only abnormal results are displayed) ?Labs Reviewed  ?COMPREHENSIVE METABOLIC PANEL - Abnormal; Notable for the following components:  ?    Result Value  ? Sodium 133 (*)   ? Glucose, Bld 262 (*)   ? All other components within normal limits  ?URINALYSIS, ROUTINE W REFLEX MICROSCOPIC - Abnormal; Notable for the following components:  ? Color, Urine STRAW (*)   ? Glucose, UA 150 (*)   ? All other components within normal limits  ?I-STAT CHEM 8, ED - Abnormal; Notable for the following components:  ? Glucose, Bld 270 (*)   ? All other components within normal limits  ?RESP PANEL BY RT-PCR (FLU A&B, COVID) [id number]  ?ETHANOL  ?PROTIME-INR  ?APTT  ?CBC  ?DIFFERENTIAL  ?RAPID URINE DRUG SCREEN, HOSP PERFORMED  ?I-STAT BETA HCG BLOOD, ED ([facility], [facility], [facility] ONLY)  ? ? ?EKG ?EKG Interpretation ? ?Date/Time:  Friday February 01 2022 08:43:12 EDT ?Ventricular Rate:  73 ?PR Interval:  140 ?QRS Duration: 88 ?QT Interval:  428 ?QTC Calculation: 472 ?R Axis:   -8 ?Text Interpretation: Sinus rhythm No significant change since last tracing Confirmed by Dorie Rank [phone]) on 02/01/2022 9:22:12 AM ? ?Radiology ?CT HEAD WO CONTRAST ? ?Result Date: 02/01/2022 ?CLINICAL DATA:  Dizziness EXAM: CT HEAD WITHOUT CONTRAST TECHNIQUE: Contiguous axial images were obtained from the base of the skull through the vertex without intravenous contrast. RADIATION DOSE REDUCTION: This exam was performed according to the departmental dose-optimization program which includes automated exposure control, adjustment of the mA and/or kV according to patient size and/or use of iterative reconstruction technique. COMPARISON:  04/07/2021 FINDINGS: Brain: No evidence of acute infarction, hemorrhage, hydrocephalus, extra-axial collection or mass lesion/mass effect. Multiple scattered small chronic lacunar infarcts including within the bilateral basal ganglia. Scattered low-density  changes within the periventricular and subcortical white matter compatible with chronic microvascular ischemic change. Vascular: No hyperdense vessel or unexpected calcification. Skull: Normal. Negative for fracture or focal lesion. Sinuses/Orbits: No acute finding. Other: None. IMPRESSION: 1. No acute intracranial abnormality. 2. Chronic microvascular ischemic change. Electronically Signed   By: Davina Poke D.O.   On: 02/01/2022 10:12  ? ?MR BRAIN WO CONTRAST ? ?Result Date: 02/01/2022 ?CLINICAL DATA:  Dizziness, nausea, vomiting since Wednesday. Stroke suspected EXAM: MRI HEAD WITHOUT CONTRAST TECHNIQUE: Multiplanar, multiecho pulse sequences of the brain and surrounding structures were obtained without intravenous contrast. COMPARISON:  CT head 02/01/2022,  04/07/2021, MR head 09/23/2021 FINDINGS: Brain: There is no evidence of acute intracranial hemorrhage, extra-axial fluid collection, or acute infarct. Background parenchymal volume is normal. The ventricles are normal in size. 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 Multiple additional remote infarcts are again seen in the right thalamus, bilateral corona radiata/periventricular white matter, and bilateral basal ganglia are unchanged compared to the prior MRI. A single punctate chronic microhemorrhage in the pons is unchanged, nonspecific but possibly hypertensive in nature. There is no suspicious parenchymal signal abnormality. There is no mass lesion. There is no mass effect or midline shift. Vascular: Normal flow voids. Skull and upper cervical spine: Normal marrow signal. Sinuses/Orbits: The paranasal sinuses are clear. The globes and orbits are unremarkable. Other: None. IMPRESSION: 1. No acute intracranial pathology. 2. 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 Multiple additional remote lacunar infarcts in the basal ganglia, right thalamus, and bilateral periventricular white matter are unchanged.

## 2022-02-01 NOTE — Discharge Instructions (Signed)
Make sure to take your blood pressure medications regularly.  Follow-up with your primary care doctor to have that rechecked.  Return to the ED needed for worsening ?

## 2022-02-01 NOTE — ED Triage Notes (Signed)
Patient c/o dizziness, and N/V since Wednesday. Patient states she did not take anything for the N/V or dizziness. Denies SHOB, chest pain, and diarrhea.   ?

## 2023-07-10 NOTE — Progress Notes (Signed)
The patient attended 06/21/23 screening event where her BP screening results were 135/88. At the event the patient noted having a pcp, insurance and no SDOH insecurities. Per chart review  the pcp is aware of hypertension and states it is well controlled. Last PCP office visit was 05/23/23. Chart review indicates a future Neurology appt on 08/19/23.   No additional Health equity team support indicated at this time.
